# Patient Record
Sex: Male | Born: 1955 | Race: White | Hispanic: No | Marital: Married | Smoking: Former smoker
Health system: Southern US, Community
[De-identification: ages and names within clinical notes are randomized; demographics above are authoritative.]

## PROBLEM LIST (undated history)

## (undated) DIAGNOSIS — R0600 Dyspnea, unspecified: Secondary | ICD-10-CM

## (undated) DIAGNOSIS — E7800 Pure hypercholesterolemia, unspecified: Secondary | ICD-10-CM

## (undated) DIAGNOSIS — I700 Atherosclerosis of aorta: Secondary | ICD-10-CM

## (undated) DIAGNOSIS — D751 Secondary polycythemia: Secondary | ICD-10-CM

## (undated) DIAGNOSIS — E119 Type 2 diabetes mellitus without complications: Secondary | ICD-10-CM

## (undated) DIAGNOSIS — H25019 Cortical age-related cataract, unspecified eye: Secondary | ICD-10-CM

## (undated) DIAGNOSIS — J189 Pneumonia, unspecified organism: Secondary | ICD-10-CM

## (undated) DIAGNOSIS — C210 Malignant neoplasm of anus, unspecified: Secondary | ICD-10-CM

## (undated) DIAGNOSIS — K219 Gastro-esophageal reflux disease without esophagitis: Secondary | ICD-10-CM

## (undated) DIAGNOSIS — Z87442 Personal history of urinary calculi: Secondary | ICD-10-CM

## (undated) DIAGNOSIS — J449 Chronic obstructive pulmonary disease, unspecified: Secondary | ICD-10-CM

## (undated) DIAGNOSIS — M1990 Unspecified osteoarthritis, unspecified site: Secondary | ICD-10-CM

## (undated) DIAGNOSIS — I10 Essential (primary) hypertension: Secondary | ICD-10-CM

## (undated) HISTORY — DX: Secondary polycythemia: D75.1

## (undated) HISTORY — PX: TONSILLECTOMY: SUR1361

## (undated) HISTORY — PX: HERNIA REPAIR: SHX51

## (undated) HISTORY — PX: BLADDER SURGERY: SHX569

---
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
## 2010-02-20 HISTORY — PX: HERNIA REPAIR: SHX51

## 2010-05-11 ENCOUNTER — Ambulatory Visit: Payer: Self-pay | Admitting: Surgery

## 2010-05-18 ENCOUNTER — Ambulatory Visit: Payer: Self-pay | Admitting: Surgery

## 2018-01-09 ENCOUNTER — Other Ambulatory Visit: Payer: Self-pay | Admitting: Family Medicine

## 2018-01-09 DIAGNOSIS — M5412 Radiculopathy, cervical region: Secondary | ICD-10-CM

## 2018-01-19 ENCOUNTER — Ambulatory Visit: Payer: Self-pay

## 2019-09-11 ENCOUNTER — Telehealth: Payer: Self-pay | Admitting: *Deleted

## 2019-09-11 ENCOUNTER — Encounter: Payer: Self-pay | Admitting: *Deleted

## 2019-09-11 DIAGNOSIS — Z122 Encounter for screening for malignant neoplasm of respiratory organs: Secondary | ICD-10-CM

## 2019-09-11 DIAGNOSIS — Z87891 Personal history of nicotine dependence: Secondary | ICD-10-CM

## 2019-09-11 NOTE — Telephone Encounter (Signed)
Received referral for initial lung cancer screening scan. Contacted patient and obtained smoking history,(former, quit 11/2012, 63 pack year) as well as answering questions related to screening process. Patient denies signs of lung cancer such as weight loss or hemoptysis. Patient denies comorbidity that would prevent curative treatment if lung cancer were found. Patient is scheduled for shared decision making visit and CT scan on 10/01/19 at 10am.

## 2019-10-01 ENCOUNTER — Other Ambulatory Visit: Payer: Self-pay

## 2019-10-01 ENCOUNTER — Inpatient Hospital Stay: Payer: BC Managed Care – PPO | Attending: Oncology | Admitting: Oncology

## 2019-10-01 ENCOUNTER — Ambulatory Visit
Admission: RE | Admit: 2019-10-01 | Discharge: 2019-10-01 | Disposition: A | Discharge status: Home / Self Care | Payer: BC Managed Care – PPO | Source: Ambulatory Visit | Attending: Oncology | Admitting: Oncology

## 2019-10-01 DIAGNOSIS — Z87891 Personal history of nicotine dependence: Secondary | ICD-10-CM

## 2019-10-01 DIAGNOSIS — Z122 Encounter for screening for malignant neoplasm of respiratory organs: Secondary | ICD-10-CM

## 2019-10-01 NOTE — Progress Notes (Signed)
Virtual Visit via Video Note  I connected with Mr. Heckmann on 10/01/19 at 10:15 AM EDT by a video enabled telemedicine application and verified that I am speaking with the correct person using two identifiers.  Location: Patient: [facility] Provider: Clinic    I discussed the limitations of evaluation and management by telemedicine and the availability of in person appointments. The patient expressed understanding and agreed to proceed.  I discussed the assessment and treatment plan with the patient. The patient was provided an opportunity to ask questions and all were answered. The patient agreed with the plan and demonstrated an understanding of the instructions.   The patient was advised to call back or seek an in-person evaluation if the symptoms worsen or if the condition fails to improve as anticipated.   In accordance with CMS guidelines, patient has met eligibility criteria including age, absence of signs or symptoms of lung cancer.  Social History   Tobacco Use  . Smoking status: Former Smoker    Packs/day: 1.50    Years: 42.00    Pack years: 63.00    Types: Cigarettes    Quit date: 11/2012    Years since quitting: 6.8  Substance Use Topics  . Alcohol use: Not on file  . Drug use: Not on file      A shared decision-making session was conducted prior to the performance of CT scan. This includes one or more decision aids, includes benefits and harms of screening, follow-up diagnostic testing, over-diagnosis, false positive rate, and total radiation exposure.   Counseling on the importance of adherence to annual lung cancer LDCT screening, impact of co-morbidities, and ability or willingness to undergo diagnosis and treatment is imperative for compliance of the program.   Counseling on the importance of continued smoking cessation for former smokers; the importance of smoking cessation for current smokers, and information about tobacco cessation interventions have been given to  patient including [place] and 1800 quit [place] programs.   Written order for lung cancer screening with LDCT has been given to the patient and any and all questions have been answered to the best of my abilities.    Yearly follow up will be coordinated by Burgess Estelle, Thoracic Navigator.  I provided 15 minutes of face-to-face video visit time during this encounter, and > 50% was spent counseling as documented under my assessment & plan.   Jacquelin Hawking, NP

## 2019-10-06 ENCOUNTER — Telehealth: Payer: Self-pay | Admitting: *Deleted

## 2019-10-06 NOTE — Telephone Encounter (Signed)
Notified patient of LDCT lung cancer screening program results with recommendation for 6 month follow up imaging. Also notified of incidental findings noted below and is encouraged to discuss further with PCP who will receive a copy of this note and/or the CT report. Patient verbalizes understanding.   IMPRESSION: Lung-RADS 3, probably benign findings. Short-term follow-up in 6 months is recommended with repeat low-dose chest CT without contrast (please use the following order, "CT CHEST LCS NODULE FOLLOW-UP W/O CM").  7.3 mm nodular opacity in the posterior right lung base.  Aortic Atherosclerosis (ICD10-I70.0) and Emphysema (ICD10-J43.9).

## 2020-02-21 DIAGNOSIS — J189 Pneumonia, unspecified organism: Secondary | ICD-10-CM

## 2020-02-21 HISTORY — DX: Pneumonia, unspecified organism: J18.9

## 2020-04-05 ENCOUNTER — Other Ambulatory Visit: Payer: Self-pay | Admitting: *Deleted

## 2020-04-05 DIAGNOSIS — R918 Other nonspecific abnormal finding of lung field: Secondary | ICD-10-CM

## 2020-04-05 DIAGNOSIS — Z87891 Personal history of nicotine dependence: Secondary | ICD-10-CM

## 2020-04-05 NOTE — Progress Notes (Addendum)
Contacted and scheduled for LCS Nodule follow up scan. Patient is a former smoker, quit 11/2012, 63 pack year history.  Patient cancelled due to potential expense of CT scan. Offered assistance to patient but he prefers to not have his 6 month follow up scan at this time.

## 2020-04-14 ENCOUNTER — Ambulatory Visit: Admission: RE | Admit: 2020-04-14 | Payer: BC Managed Care – PPO | Source: Ambulatory Visit

## 2020-05-05 ENCOUNTER — Ambulatory Visit: Payer: BC Managed Care – PPO

## 2020-07-30 ENCOUNTER — Other Ambulatory Visit: Payer: Self-pay | Admitting: *Deleted

## 2020-07-30 DIAGNOSIS — Z87891 Personal history of nicotine dependence: Secondary | ICD-10-CM

## 2020-07-30 DIAGNOSIS — Z122 Encounter for screening for malignant neoplasm of respiratory organs: Secondary | ICD-10-CM

## 2020-07-30 NOTE — Progress Notes (Signed)
Contacted and scheduled for annual lung screening scan. Patient is a former smoker, quit 11/2012, 63 pack year history.

## 2020-10-01 ENCOUNTER — Other Ambulatory Visit: Payer: Self-pay | Admitting: Acute Care

## 2020-10-01 DIAGNOSIS — Z87891 Personal history of nicotine dependence: Secondary | ICD-10-CM

## 2020-10-01 DIAGNOSIS — Z122 Encounter for screening for malignant neoplasm of respiratory organs: Secondary | ICD-10-CM

## 2020-10-05 ENCOUNTER — Other Ambulatory Visit: Payer: Self-pay | Admitting: *Deleted

## 2020-10-05 DIAGNOSIS — Z87891 Personal history of nicotine dependence: Secondary | ICD-10-CM

## 2020-10-06 ENCOUNTER — Ambulatory Visit: Payer: BC Managed Care – PPO

## 2020-10-13 ENCOUNTER — Ambulatory Visit
Admission: RE | Admit: 2020-10-13 | Discharge: 2020-10-13 | Disposition: A | Discharge status: Home / Self Care | Payer: BC Managed Care – PPO | Source: Ambulatory Visit | Attending: Acute Care | Admitting: Acute Care

## 2020-10-13 ENCOUNTER — Other Ambulatory Visit: Payer: Self-pay

## 2020-10-13 DIAGNOSIS — Z87891 Personal history of nicotine dependence: Secondary | ICD-10-CM | POA: Insufficient documentation

## 2020-10-25 NOTE — Progress Notes (Signed)
Please call patient and let them  know their  low dose Ct was read as a Lung RADS 2: nodules that are benign in appearance and behavior with a very low likelihood of becoming a clinically active cancer due to size or lack of growth. Recommendation per radiology is for a repeat LDCT in 12 months. .Please let them  know we will order and schedule their  annual screening scan for 09/2021. Please let them  know there was notation of CAD on their  scan.  Please remind the patient  that this is a non-gated exam therefore degree or severity of disease  cannot be determined. Please have them  follow up with their PCP regarding potential risk factor modification, dietary therapy or pharmacologic therapy if clinically indicated. Pt.  is not currently on statin therapy. Please place order for annual  screening scan for  09/2021 and fax results to PCP. Thanks so much.  There was notation of Atherosclerotic calcification . No Cardiology notes in [place]. No statin, please have patient follow up with PCP. Thanks so much

## 2020-10-26 ENCOUNTER — Other Ambulatory Visit: Payer: Self-pay | Admitting: *Deleted

## 2020-10-26 DIAGNOSIS — Z87891 Personal history of nicotine dependence: Secondary | ICD-10-CM

## 2020-12-23 ENCOUNTER — Ambulatory Visit: Payer: Self-pay | Admitting: Surgery

## 2020-12-23 NOTE — H&P (Signed)
Subjective: CC: Anal discharge [R19.8]   HPI:  Joseph Mcgee is a 65 y.o. male who was referrred by Dion Body, MD for above. Symptoms were first noted several months ago. he has some rectal bleeding occurring a few times per week. Bleeding is described as scant, mixed with yellow discharge. Pain is dull and intermittent, confined to the perianal area, without radiation.  Associated with nothing specific, exacerbated by nothing specific.  Feels noticeable lump in the area with drainage, then decreases.  Never had colonoscopy.  Does not report change in BM   Past Medical History:  has a past medical history of Arthritis, Cataract cortical, senile, Erectile dysfunction, Essential hypertension, GERD (gastroesophageal reflux disease), Pneumonia due to COVID-19 virus (02/2020), Pure hypercholesterolemia, and Type 2 diabetes mellitus (CMS-HCC).  Past Surgical History:  has a past surgical history that includes  Bladder surgery, 5732; Right umbilical hernia repair and inguinal hernia repair, 05/18/10; Tonsillectomy (10/29/1957); and Hernia repair.  Family History: family history includes Alzheimer's disease in his maternal aunt and mother; Coronary Artery Disease (Blocked arteries around heart) in his father; Dementia in his mother; Diabetes type II in his mother; High blood pressure (Hypertension) in his father, mother, and sister; No Known Problems in his sister; Obesity in his mother and sister; Stroke in his father and paternal grandmother.  Social History:  reports that he quit smoking about 8 years ago. His smoking use included cigarettes. He started smoking about 49 years ago. He has a 41.00 pack-year smoking history. He has never used smokeless tobacco. He reports that he does not drink alcohol and does not use drugs.  Current Medications: has a current medication list which includes the following prescription(s): ascorbic acid (vitamin c), aspirin, contour next test strips, toujeo solostar  u-300 insulin, cyanocobalamin, dapagliflozin, ezetimibe, fluticasone propionate, hydrochlorothiazide, insulin lispro, lancets, losartan, magnesium, metformin, omega-3 fatty acids-fish oil, semaglutide, unifine pentips, and zinc.  Allergies:  Allergies as of 12/22/2020 - Reviewed 11/10/2020 Allergen Reaction Noted  Lisinopril Cough 04/25/2017  Lovastatin Muscle Pain 09/29/2013  Vasotec [enalapril maleate] Cough 09/29/2013   ROS:  A 15 point review of systems was performed and pertinent positives and negatives noted in HPI  Objective:    There were no vitals taken for this visit.  Constitutional :  No distress, cooperative, alert Lymphatics/Throat:  Supple with no lymphadenopathy Respiratory:  Clear to auscultation bilaterally Cardiovascular:  Regular rate and rhythm Gastrointestinal: Soft, non-tender, non-distended, no organomegaly. Musculoskeletal: Steady gait and movement Skin: Cool and moist, no surgical scars Psychiatric: Normal affect, non-agitated, not confused Rectal: External exam normal.  DRE noted some fullness with slightly tighter sphincter tone, further visualization of the area noted a possible ulceration at the right lateral aspect with no active bleeding or discharge. right at anal verge with more TTP compared to other areas. Anoscopy aborted due to discomfort     LABS:  n/a   RADS:  Assessment:    Anal discharge [R19.8] source unclear but possible ulcerated lesion noted slightly proximal to anal verge on limited bedside exam.  Pt also overdue for screening colonoscopy, so recommended EUA and colonoscopy at same time.  Plan:   1. Anal discharge [R19.8] Discussed risks/benefits/alternatives to surgery.  Alternatives include the options of observation, medical management.  Benefits include symptomatic relief.  I discussed  in detail and the complications related to the operation and the anesthesia, including bleeding, infection, recurrence, remote possibility of  temporary or permanent fecal incontinence, poor/delayed wound healing, chronic pain, and additional procedures to  address said risks. The risks of general anesthetic, if used, includes MI, CVA, sudden death or even reaction to anesthetic medications also discussed.    The patient understands the risks, any and all questions were answered to the patient's satisfaction.  2. Patient has elected to proceed with surgical treatment. Procedure will be scheduled.  Written consent was obtained.

## 2021-02-21 ENCOUNTER — Inpatient Hospital Stay: Admission: RE | Admit: 2021-02-21 | Payer: BC Managed Care – PPO | Source: Ambulatory Visit

## 2021-02-22 ENCOUNTER — Other Ambulatory Visit: Payer: Self-pay

## 2021-02-22 ENCOUNTER — Other Ambulatory Visit
Admission: RE | Admit: 2021-02-22 | Discharge: 2021-02-22 | Disposition: A | Discharge status: Home / Self Care | Payer: Medicare Other | Source: Ambulatory Visit | Attending: Surgery | Admitting: Surgery

## 2021-02-22 DIAGNOSIS — I10 Essential (primary) hypertension: Secondary | ICD-10-CM

## 2021-02-22 HISTORY — DX: Essential (primary) hypertension: I10

## 2021-02-22 HISTORY — DX: Unspecified osteoarthritis, unspecified site: M19.90

## 2021-02-22 HISTORY — DX: Type 2 diabetes mellitus without complications: E11.9

## 2021-02-22 HISTORY — DX: Pneumonia, unspecified organism: J18.9

## 2021-02-22 HISTORY — DX: Chronic obstructive pulmonary disease, unspecified: J44.9

## 2021-02-22 HISTORY — DX: Dyspnea, unspecified: R06.00

## 2021-02-22 NOTE — Pre-Procedure Instructions (Signed)
Wife, Joseph Mcgee assisted in call as [name] is at work and unavailable.

## 2021-02-22 NOTE — Patient Instructions (Addendum)
Your procedure is scheduled on: 03/03/21 - Thursday Report to the Registration Desk on the 1st floor of the [facility]. To find out your arrival time, please call [phone] between 1PM - 3PM on: 03/02/21 - Wednesday Report to [facility] for Ekg/Labs on 02/23/21 at 9:30 am.  REMEMBER: Instructions that are not followed completely may result in serious medical risk, up to and including death; or upon the discretion of your surgeon and anesthesiologist your surgery may need to be rescheduled.  Do not eat food after midnight the night before surgery.  No gum chewing, lozengers or hard candies.  You may however, drink CLEAR liquids up to 2 hours before you are scheduled to arrive for your surgery. Do not drink anything within 2 hours of your scheduled arrival time.  Clear liquids include: - water  Type 1 and Type 2 diabetics should only drink water.  TAKE THESE MEDICATIONS THE MORNING OF SURGERY WITH A SIP OF WATER:  - ezetimibe (ZETIA) 10 MG tablet  Diabetes medications :   - Do not take dapagliflozin propanediol (FARXIGA) 10 MG TABS tablet on 01/09, 01/10, 01/11 and do not take the day of surgery.  - Only inject 1/2 of bedtime Insulin , insulin glargine, 1 Unit Dial, (TOUJEO SOLOSTAR) 300 UNIT/ML Solostar Pen the night before procedure.  - Do not take MetFORMIN (GLUCOPHAGE) 1000 MG tablet on 01/10, 01/11 and do not take the day of procedure.   Follow recommendations from Cardiologist, Pulmonologist or PCP regarding stopping Aspirin, Coumadin, Plavix, Eliquis, Pradaxa, or Pletal. Stop taking Aspirin 81 mg beginning 02/25/21.  One week prior to surgery: Stop Anti-inflammatories (NSAIDS) such as Advil, Aleve, Ibuprofen, Motrin, Naproxen, Naprosyn and Aspirin based products such as Excedrin, Goodys Powder, BC Powder.  Stop ANY OVER THE COUNTER supplements until after surgery.  You may however, continue to take Tylenol if needed for pain up until the day of surgery.  No  Alcohol for 24 hours before or after surgery.  No Smoking including e-cigarettes for 24 hours prior to surgery.  No chewable tobacco products for at least 6 hours prior to surgery.  No nicotine patches on the day of surgery.  Do not use any "recreational" drugs for at least a week prior to your surgery.  Please be advised that the combination of cocaine and anesthesia may have negative outcomes, up to and including death. If you test positive for cocaine, your surgery will be cancelled.  On the morning of surgery brush your teeth with toothpaste and water, you may rinse your mouth with mouthwash if you wish. Do not swallow any toothpaste or mouthwash.  Take a fresh shower the day of procedure, you may apply deodorant.  Do not wear jewelry, make-up, hairpins, clips or nail polish.  Do not wear lotions, powders, or perfumes.   Do not shave body from the neck down 48 hours prior to surgery just in case you cut yourself which could leave a site for infection.  Also, freshly shaved skin may become irritated if using the CHG soap.  Contact lenses, hearing aids and dentures may not be worn into surgery.  Do not bring valuables to the hospital. [facility] is not responsible for any missing/lost belongings or valuables.   Notify your doctor if there is any change in your medical condition (cold, fever, infection).  Wear comfortable clothing (specific to your surgery type) to the hospital.  After surgery, you can help prevent lung complications by doing breathing exercises.  Take deep  breaths and cough every 1-2 hours. Your doctor may order a device called an Incentive Spirometer to help you take deep breaths. When coughing or sneezing, hold a pillow firmly against your incision with both hands. This is called splinting. Doing this helps protect your incision. It also decreases belly discomfort.  If you are being admitted to the hospital overnight, leave your suitcase in the car. After  surgery it may be brought to your room.  If you are being discharged the day of surgery, you will not be allowed to drive home. You will need a responsible adult (18 years or older) to drive you home and stay with you that night.   If you are taking public transportation, you will need to have a responsible adult (18 years or older) with you. Please confirm with your physician that it is acceptable to use public transportation.   Please call the [facility] at [phone] if you have any questions about these instructions.  Surgery Visitation Policy:  Patients undergoing a surgery or procedure may have one family member or support person with them as long as that person is not COVID-19 positive or experiencing its symptoms.  That person may remain in the waiting area during the procedure and may rotate out with other people.  Inpatient Visitation:    Visiting hours are 7 a.m. to 8 p.m. Up to two visitors ages 16+ are allowed at one time in a patient room. The visitors may rotate out with other people during the day. Visitors must check out when they leave, or other visitors will not be allowed. One designated support person may remain overnight. The visitor must pass COVID-19 screenings, use hand sanitizer when entering and exiting the patients room and wear a mask at all times, including in the patients room. Patients must also wear a mask when staff or their visitor are in the room. Masking is required regardless of vaccination status.

## 2021-02-23 ENCOUNTER — Other Ambulatory Visit
Admission: RE | Admit: 2021-02-23 | Discharge: 2021-02-23 | Disposition: A | Discharge status: Home / Self Care | Payer: Medicare Other | Source: Ambulatory Visit | Attending: Surgery | Admitting: Surgery

## 2021-02-23 DIAGNOSIS — Z01818 Encounter for other preprocedural examination: Secondary | ICD-10-CM | POA: Insufficient documentation

## 2021-02-23 DIAGNOSIS — I10 Essential (primary) hypertension: Secondary | ICD-10-CM | POA: Diagnosis not present

## 2021-02-23 LAB — CBC
HCT: 49.7 % (ref 39.0–52.0)
Hemoglobin: 17 g/dL (ref 13.0–17.0)
MCH: 30.2 pg (ref 26.0–34.0)
MCHC: 34.2 g/dL (ref 30.0–36.0)
MCV: 88.3 fL (ref 80.0–100.0)
Platelets: 276 10*3/uL (ref 150–400)
RBC: 5.63 MIL/uL (ref 4.22–5.81)
RDW: 13.2 % (ref 11.5–15.5)
WBC: 8.7 10*3/uL (ref 4.0–10.5)
nRBC: 0 % (ref 0.0–0.2)

## 2021-02-23 LAB — BASIC METABOLIC PANEL
Anion gap: 10 (ref 5–15)
BUN: 25 mg/dL — ABNORMAL HIGH (ref 8–23)
CO2: 23 mmol/L (ref 22–32)
Calcium: 8.8 mg/dL — ABNORMAL LOW (ref 8.9–10.3)
Chloride: 103 mmol/L (ref 98–111)
Creatinine, Ser: 1.09 mg/dL (ref 0.61–1.24)
GFR, Estimated: >60 mL/min (ref >60)
Glucose, Bld: 290 mg/dL — ABNORMAL HIGH (ref 70–99)
Potassium: 4 mmol/L (ref 3.5–5.1)
Sodium: 136 mmol/L (ref 135–145)

## 2021-03-03 ENCOUNTER — Encounter
Admission: RE | Disposition: A | Discharge status: Home / Self Care | Payer: Self-pay | Source: Ambulatory Visit | Attending: Surgery

## 2021-03-03 ENCOUNTER — Ambulatory Visit: Payer: Medicare Other | Admitting: Anesthesiology

## 2021-03-03 ENCOUNTER — Ambulatory Visit
Admission: RE | Admit: 2021-03-03 | Discharge: 2021-03-03 | Disposition: A | Discharge status: Home / Self Care | Payer: Medicare Other | Source: Ambulatory Visit | Attending: Surgery | Admitting: Surgery

## 2021-03-03 ENCOUNTER — Encounter: Payer: Self-pay | Admitting: Surgery

## 2021-03-03 ENCOUNTER — Other Ambulatory Visit: Payer: Self-pay

## 2021-03-03 DIAGNOSIS — E7800 Pure hypercholesterolemia, unspecified: Secondary | ICD-10-CM | POA: Insufficient documentation

## 2021-03-03 DIAGNOSIS — J449 Chronic obstructive pulmonary disease, unspecified: Secondary | ICD-10-CM | POA: Diagnosis not present

## 2021-03-03 DIAGNOSIS — E119 Type 2 diabetes mellitus without complications: Secondary | ICD-10-CM | POA: Diagnosis not present

## 2021-03-03 DIAGNOSIS — K219 Gastro-esophageal reflux disease without esophagitis: Secondary | ICD-10-CM | POA: Diagnosis not present

## 2021-03-03 DIAGNOSIS — C211 Malignant neoplasm of anal canal: Secondary | ICD-10-CM | POA: Diagnosis not present

## 2021-03-03 DIAGNOSIS — I10 Essential (primary) hypertension: Secondary | ICD-10-CM | POA: Insufficient documentation

## 2021-03-03 DIAGNOSIS — R198 Other specified symptoms and signs involving the digestive system and abdomen: Secondary | ICD-10-CM | POA: Diagnosis present

## 2021-03-03 DIAGNOSIS — Z8616 Personal history of COVID-19: Secondary | ICD-10-CM | POA: Diagnosis not present

## 2021-03-03 DIAGNOSIS — K641 Second degree hemorrhoids: Secondary | ICD-10-CM | POA: Diagnosis not present

## 2021-03-03 DIAGNOSIS — Z87891 Personal history of nicotine dependence: Secondary | ICD-10-CM | POA: Insufficient documentation

## 2021-03-03 DIAGNOSIS — Z1211 Encounter for screening for malignant neoplasm of colon: Secondary | ICD-10-CM | POA: Diagnosis not present

## 2021-03-03 DIAGNOSIS — K625 Hemorrhage of anus and rectum: Secondary | ICD-10-CM | POA: Insufficient documentation

## 2021-03-03 DIAGNOSIS — K629 Disease of anus and rectum, unspecified: Secondary | ICD-10-CM

## 2021-03-03 HISTORY — PX: COLONOSCOPY WITH PROPOFOL: SHX5780

## 2021-03-03 HISTORY — PX: EVALUATION UNDER ANESTHESIA WITH HEMORRHOIDECTOMY: SHX5624

## 2021-03-03 LAB — GLUCOSE, CAPILLARY
Glucose-Capillary: 126 mg/dL — ABNORMAL HIGH (ref 70–99)
Glucose-Capillary: 167 mg/dL — ABNORMAL HIGH (ref 70–99)

## 2021-03-03 SURGERY — COLONOSCOPY WITH PROPOFOL
Anesthesia: General

## 2021-03-03 SURGERY — EXAM UNDER ANESTHESIA WITH HEMORRHOIDECTOMY
Anesthesia: General | Site: Anus

## 2021-03-03 MED ORDER — GELATIN ABSORBABLE 12-7 MM EX MISC
CUTANEOUS | Status: AC
  Filled 2021-03-03: qty 1

## 2021-03-03 MED ORDER — PROPOFOL 500 MG/50ML IV EMUL
INTRAVENOUS | Status: AC
  Filled 2021-03-03: qty 50

## 2021-03-03 MED ORDER — CEFAZOLIN SODIUM-DEXTROSE 2-3 GM-%(50ML) IV SOLR
INTRAVENOUS | Status: DC | PRN
  Administered 2021-03-03: 2 g via INTRAVENOUS

## 2021-03-03 MED ORDER — FENTANYL CITRATE (PF) 100 MCG/2ML IJ SOLN: 25.0000 ug | INTRAMUSCULAR | Status: DC | PRN

## 2021-03-03 MED ORDER — 0.9 % SODIUM CHLORIDE (POUR BTL) OPTIME
TOPICAL | Status: DC | PRN
  Administered 2021-03-03: 20 mL

## 2021-03-03 MED ORDER — SODIUM CHLORIDE 0.9 % IV SOLN: INTRAVENOUS | Status: DC

## 2021-03-03 MED ORDER — BUPIVACAINE-EPINEPHRINE (PF) 0.5% -1:200000 IJ SOLN
INTRAMUSCULAR | Status: DC | PRN
  Administered 2021-03-03: 15 mL

## 2021-03-03 MED ORDER — HYDROCODONE-ACETAMINOPHEN 5-325 MG PO TABS: 1.0000 | ORAL_TABLET | Freq: Four times a day (QID) | ORAL | 0 refills | Status: DC | PRN

## 2021-03-03 MED ORDER — PHENYLEPHRINE HCL (PRESSORS) 10 MG/ML IV SOLN
INTRAVENOUS | Status: DC | PRN
  Administered 2021-03-03 (×2): 160 ug via INTRAVENOUS

## 2021-03-03 MED ORDER — LIDOCAINE HCL URETHRAL/MUCOSAL 2 % EX GEL
CUTANEOUS | Status: AC
  Filled 2021-03-03: qty 5

## 2021-03-03 MED ORDER — FENTANYL CITRATE (PF) 100 MCG/2ML IJ SOLN
INTRAMUSCULAR | Status: DC | PRN
  Administered 2021-03-03 (×2): 25 ug via INTRAVENOUS

## 2021-03-03 MED ORDER — PROPOFOL 10 MG/ML IV BOLUS
INTRAVENOUS | Status: DC | PRN
  Administered 2021-03-03: 60 mg via INTRAVENOUS

## 2021-03-03 MED ORDER — CEFAZOLIN SODIUM-DEXTROSE 2-4 GM/100ML-% IV SOLN
INTRAVENOUS | Status: AC
  Filled 2021-03-03: qty 100

## 2021-03-03 MED ORDER — DOCUSATE SODIUM 100 MG PO CAPS: 100.0000 mg | ORAL_CAPSULE | Freq: Two times a day (BID) | ORAL | 0 refills | Status: AC | PRN

## 2021-03-03 MED ORDER — CHLORHEXIDINE GLUCONATE 0.12 % MT SOLN
OROMUCOSAL | Status: AC
  Administered 2021-03-03: 15 mL via OROMUCOSAL
  Filled 2021-03-03: qty 15

## 2021-03-03 MED ORDER — IBUPROFEN 800 MG PO TABS: 800.0000 mg | ORAL_TABLET | Freq: Three times a day (TID) | ORAL | 0 refills | Status: DC | PRN

## 2021-03-03 MED ORDER — CHLORHEXIDINE GLUCONATE CLOTH 2 % EX PADS: 6.0000 | MEDICATED_PAD | Freq: Once | CUTANEOUS | Status: DC

## 2021-03-03 MED ORDER — BUPIVACAINE LIPOSOME 1.3 % IJ SUSP
INTRAMUSCULAR | Status: DC | PRN
  Administered 2021-03-03: 20 mL

## 2021-03-03 MED ORDER — PROPOFOL 500 MG/50ML IV EMUL
INTRAVENOUS | Status: DC | PRN
  Administered 2021-03-03: 200 ug/kg/min via INTRAVENOUS

## 2021-03-03 MED ORDER — CHLORHEXIDINE GLUCONATE 0.12 % MT SOLN: 15.0000 mL | Freq: Once | OROMUCOSAL | Status: AC

## 2021-03-03 MED ORDER — BUPIVACAINE LIPOSOME 1.3 % IJ SUSP
INTRAMUSCULAR | Status: AC
  Filled 2021-03-03: qty 20

## 2021-03-03 MED ORDER — ACETAMINOPHEN 325 MG PO TABS: 650.0000 mg | ORAL_TABLET | Freq: Three times a day (TID) | ORAL | 0 refills | Status: DC | PRN

## 2021-03-03 MED ORDER — BUPIVACAINE-EPINEPHRINE (PF) 0.5% -1:200000 IJ SOLN
INTRAMUSCULAR | Status: AC
  Filled 2021-03-03: qty 30

## 2021-03-03 MED ORDER — FENTANYL CITRATE (PF) 100 MCG/2ML IJ SOLN
INTRAMUSCULAR | Status: AC
  Filled 2021-03-03: qty 2

## 2021-03-03 MED ORDER — LIDOCAINE HCL (PF) 2 % IJ SOLN
INTRAMUSCULAR | Status: AC
  Filled 2021-03-03: qty 5

## 2021-03-03 MED ORDER — LIDOCAINE 5 % EX OINT: 1.0000 "application " | TOPICAL_OINTMENT | CUTANEOUS | 0 refills | Status: DC | PRN

## 2021-03-03 MED ORDER — PHENYLEPHRINE HCL (PRESSORS) 10 MG/ML IV SOLN
INTRAVENOUS | Status: AC
  Filled 2021-03-03: qty 1

## 2021-03-03 MED ORDER — ORAL CARE MOUTH RINSE: 15.0000 mL | Freq: Once | OROMUCOSAL | Status: AC

## 2021-03-03 SURGICAL SUPPLY — 33 items
BLADE SURG 15 STRL LF DISP TIS (BLADE) ×1 IMPLANT
BLADE SURG 15 STRL SS (BLADE) ×2
BRIEF STRETCH FOR OB PAD XXL (UNDERPADS AND DIAPERS) ×2 IMPLANT
DRAPE PERI LITHO V/GYN (MISCELLANEOUS) ×2 IMPLANT
DRAPE UNDER BUTTOCK W/FLU (DRAPES) ×2 IMPLANT
DRSG GAUZE FLUFF 36X18 (GAUZE/BANDAGES/DRESSINGS) ×2 IMPLANT
ELECT REM PT RETURN 9FT ADLT (ELECTROSURGICAL) ×2
ELECTRODE REM PT RTRN 9FT ADLT (ELECTROSURGICAL) ×1 IMPLANT
GAUZE 4X4 16PLY ~~LOC~~+RFID DBL (SPONGE) ×2 IMPLANT
GLOVE SURG SYN 6.5 ES PF (GLOVE) ×2 IMPLANT
GLOVE SURG SYN 6.5 PF PI (GLOVE) ×1 IMPLANT
GLOVE SURG UNDER POLY LF SZ7 (GLOVE) ×2 IMPLANT
GOWN STRL REUS W/ TWL LRG LVL3 (GOWN DISPOSABLE) ×2 IMPLANT
GOWN STRL REUS W/TWL LRG LVL3 (GOWN DISPOSABLE) ×4
KIT TURNOVER CYSTO (KITS) ×2 IMPLANT
LABEL OR SOLS (LABEL) ×2 IMPLANT
MANIFOLD NEPTUNE II (INSTRUMENTS) ×2 IMPLANT
NDL HYPO 25X1 1.5 SAFETY (NEEDLE) ×1 IMPLANT
NEEDLE HYPO 22GX1.5 SAFETY (NEEDLE) ×2 IMPLANT
NEEDLE HYPO 25X1 1.5 SAFETY (NEEDLE) ×2 IMPLANT
NS IRRIG 500ML POUR BTL (IV SOLUTION) ×2 IMPLANT
PACK BASIN MINOR ARMC (MISCELLANEOUS) ×2 IMPLANT
PAD PREP 24X41 OB/GYN DISP (PERSONAL CARE ITEMS) ×2 IMPLANT
SHEARS HARMONIC 9CM CVD (BLADE) ×1 IMPLANT
SOL PREP PVP 2OZ (MISCELLANEOUS) ×2
SOLUTION PREP PVP 2OZ (MISCELLANEOUS) ×1 IMPLANT
SURGILUBE 2OZ TUBE FLIPTOP (MISCELLANEOUS) ×2 IMPLANT
SUT SILK 3-0 (SUTURE) ×1 IMPLANT
SUT VIC AB 3-0 SH 27 (SUTURE) ×2
SUT VIC AB 3-0 SH 27X BRD (SUTURE) ×1 IMPLANT
SYR 10ML LL (SYRINGE) ×2 IMPLANT
SYR 20ML LL LF (SYRINGE) ×2 IMPLANT
WATER STERILE IRR 500ML POUR (IV SOLUTION) ×2 IMPLANT

## 2021-03-03 NOTE — Anesthesia Procedure Notes (Signed)
Date/Time: 03/03/2021 3:36 PM Performed by: Nelda Marseille, CRNA Oxygen Delivery Method: Simple face mask

## 2021-03-03 NOTE — Op Note (Signed)
Preoperative diagnosis: anal lesion.   Postoperative diagnosis: same plus second degree hemorrhoids  Procedure: exam under anesthesia, excision of anal lesion  Surgeon: Lysle Pearl  Anesthesia: general  Specimen: anal lesion x1  Complications: none  EBL: 74mL  Wound classification: Clean Contaminated  Indications: Patient is a 66 y.o. male was found to have symptomatic anal lesion, see H&P  Findings: Right posterior anal lesion 2. Internal and external anal sphincter palpated and preserved 3. Adequate hemostasis  Description of procedure: The patient was brought to the operating room and general anesthesia was induced. Patient was placed high lithotomy position. A time-out was completed verifying correct patient, procedure, site, positioning, and implant(s) and/or special equipment prior to beginning this procedure. The perineum was prepped and draped in standard sterile fashion. Local anesthetic was injected as a perianal block. An anoscope was introduced and flat anal lesion measuring approximately 5cm x 2.5cm wide noted on right posterior area at anal verge extending proximal.  The lesion was able to be lifted off the palpable anal sphincter, so decision made to proceed with full excision.   A harmonic was placed across under the lesoin and removed completely  Specimen was passed off operative field pending pathology.  3-0 Vicryl then used to close the open wound.  No sphincter involvement was noted and grossly visible lesion area completely excised.  Hemorrhoids noted on left side but due to the exact pathology of lesion removed pending, decision made to forego proceeding with additional hemorrhoidecotmy.      Last inspection of the anal canal did not note any additional pathology. Exparel injected as a perianal block. A gauze pad was tucked between the gluteal folds, and secured in place with mesh underwear.  The patient tolerated the procedure well and was taken to the postanesthesia  care unit in stable condition.  Sponge and instrument count correct at end of procedure.

## 2021-03-03 NOTE — Interval H&P Note (Signed)
History and Physical Interval Note:  03/03/2021 2:39 PM  Joseph Mcgee  has presented today for surgery, with the diagnosis of Anal drainage.  The various methods of treatment have been discussed with the patient and family. After consideration of risks, benefits and other options for treatment, the patient has consented to  Procedure(s): EXAM UNDER ANESTHESIA WITH HEMORRHOIDECTOMY (N/A) COLONOSCOPY WITH PROPOFOL (N/A) as a surgical intervention.  The patient's history has been reviewed, patient examined, no change in status, stable for surgery.  I have reviewed the patient's chart and labs.  Questions were answered to the patient's satisfaction.     Cari Burgo Lysle Pearl

## 2021-03-03 NOTE — Discharge Instructions (Addendum)
Hemorrhoids, Care After This sheet gives you information about how to care for yourself after your procedure. Your health care provider may also give you more specific instructions. If you have problems or questions, contact your health care provider. What can I expect after the procedure? After the procedure, it is common to have: Soreness. Bruising. Itching.  Follow these instructions at home: site care Follow instructions from your health care provider about how to take care of your site. Make sure you: LEAVE packing in place until it falls out on its own.  No need to replace afterwards Leave stitches (sutures), skin glue, or adhesive strips in place.  If the area bleeds or bruises, apply gentle pressure for 10 minutes. OK TO SHOWER IN 24HRS  General instructions RESUME ASPIRIN IN 48HRS Rest and then return to your normal activities as told by your health care provider. tylenol and advil as needed for discomfort.  Please alternate between the two every four hours as needed for pain.    Use narcotics, if prescribed, only when tylenol and motrin is not enough to control pain.  325-650mg  every 8hrs to max of 3000mg /24hrs (including the 325mg  in every norco dose) for the tylenol.    Advil up to 800mg  per dose every 8hrs as needed for pain.   Keep all follow-up visits as told by your health care provider. This is important. Contact a health care provider if you have excessive: redness, swelling, or pain around your site. blood coming from your site. pus or a bad smell coming from your site. You have a fever.  Get help right away if: You have bleeding that does not stop with pressure or a dressing. Summary After the procedure, it is common to have some soreness, bruising, and itching at the site. Follow instructions from your health care provider about how to take care of your site. Keep all follow-up visits as told by your health care provider. This is important. This information is  not intended to replace advice given to you by your health care provider. Make sure you discuss any questions you have with your health care provider. Document Released: 03/05/2015 Document Revised: 08/06/2017 Document Reviewed: 08/06/2017 Elsevier Interactive Patient Education  2019 [place]   The drugs that you were given will stay in your system until tomorrow so for the next 24 hours you should not:  Drive an automobile Make any legal decisions Drink any alcoholic beverage   You may resume regular meals tomorrow.  Today it is better to start with liquids and gradually work up to solid foods.  You may eat anything you prefer, but it is better to start with liquids, then soup and crackers, and gradually work up to solid foods.   Please notify your doctor immediately if you have any unusual bleeding, trouble breathing, redness and pain at the surgery site, drainage, fever, or pain not relieved by medication.    Additional Instructions:     Please contact your physician with any problems or Same Day Surgery at [phone], Monday through Friday 6 am to 4 pm, or [place] at [facility] number at [phone].

## 2021-03-03 NOTE — H&P (Signed)
Subjective:  CC: Anal discharge [R19.8]   HPI: Joseph Mcgee is a 66 y.o. male who was referrred by Dion Body, MD for above. Symptoms were first noted several months ago. he has some rectal bleeding occurring a few times per week. Bleeding is described as scant, mixed with yellow discharge. Pain is dull and intermittent, confined to the perianal area, without radiation. Associated with nothing specific, exacerbated by nothing specific.  Feels noticeable lump in the area with drainage, then decreases.  Never had colonoscopy. Does not report change in BM  Past Medical History: has a past medical history of Arthritis, Cataract cortical, senile, Erectile dysfunction, Essential hypertension, GERD (gastroesophageal reflux disease), Pneumonia due to COVID-19 virus (02/2020), Pure hypercholesterolemia, and Type 2 diabetes mellitus (CMS-HCC).  Past Surgical History: has a past surgical history that includes Bladder surgery, 8280; Right umbilical hernia repair and inguinal hernia repair, 05/18/10; Tonsillectomy (10/29/1957); and Hernia repair.  Family History: family history includes Alzheimer's disease in his maternal aunt and mother; Coronary Artery Disease (Blocked arteries around heart) in his father; Dementia in his mother; Diabetes type II in his mother; High blood pressure (Hypertension) in his father, mother, and sister; No Known Problems in his sister; Obesity in his mother and sister; Stroke in his father and paternal grandmother.  Social History: reports that he quit smoking about 8 years ago. His smoking use included cigarettes. He started smoking about 49 years ago. He has a 41.00 pack-year smoking history. He has never used smokeless tobacco. He reports that he does not drink alcohol and does not use drugs.  Current Medications: has a current medication list which includes the following prescription(s): ascorbic acid (vitamin c), aspirin, contour next test strips, toujeo solostar u-300  insulin, cyanocobalamin, dapagliflozin, ezetimibe, fluticasone propionate, hydrochlorothiazide, insulin lispro, lancets, losartan, magnesium, metformin, omega-3 fatty acids-fish oil, semaglutide, unifine pentips, and zinc.  Allergies:  Allergies as of 12/22/2020 - Reviewed 11/10/2020  Allergen Reaction Noted   Lisinopril Cough 04/25/2017   Lovastatin Muscle Pain 09/29/2013   Vasotec [enalapril maleate] Cough 09/29/2013   ROS:  A 15 point review of systems was performed and pertinent positives and negatives noted in HPI  Objective:    There were no vitals taken for this visit.  Constitutional : No distress, cooperative, alert  Lymphatics/Throat: Supple with no lymphadenopathy  Respiratory: Clear to auscultation bilaterally  Cardiovascular: Regular rate and rhythm  Gastrointestinal: Soft, non-tender, non-distended, no organomegaly.  Musculoskeletal: Steady gait and movement  Skin: Cool and moist, no surgical scars  Psychiatric: Normal affect, non-agitated, not confused  Rectal: External exam normal. DRE noted some fullness with slightly tighter sphincter tone, further visualization of the area noted a possible ulceration at the right lateral aspect with no active bleeding or discharge. right at anal verge with more TTP compared to other areas. Anoscopy aborted due to discomfort    LABS:  n/a   RADS:  Assessment:    Anal discharge [R19.8] source unclear but possible ulcerated lesion noted slightly proximal to anal verge on limited bedside exam. Pt also overdue for screening colonoscopy, so recommended EUA and colonoscopy at same time.  Plan:   1. Anal discharge [R19.8] Discussed risks/benefits/alternatives to surgery. Alternatives include the options of observation, medical management. Benefits include symptomatic relief. I discussed in detail and the complications related to the operation and the anesthesia, including bleeding, infection, recurrence, remote possibility of  temporary or permanent fecal incontinence, poor/delayed wound healing, chronic pain, and additional procedures to address said risks. The risks of  general anesthetic, if used, includes MI, CVA, sudden death or even reaction to anesthetic medications also discussed.   The patient understands the risks, any and all questions were answered to the patient's satisfaction.  2. Patient has elected to proceed with surgical treatment. Procedure will be scheduled. Written consent was obtained.

## 2021-03-03 NOTE — Op Note (Signed)
[facility]astroenter[facility] Patient Name: Joseph Mcgee Procedure Date: 03/03/2021 2:57 PM MRN: [id number] Account #: [id number] Date of Birth: [birth year] Admit Type: Outpatient Age: 66 Room: [facility] ENDO ROOM 3 Gender: Male Note Status: Finalized Instrument Name: Colonoscope [id number],Colonoscope [id number] Procedure:             Colonoscopy Indications:           Screening for colorectal malignant neoplasm Providers:             Eliseo Squires MD, MD Medicines:             Propofol per Anesthesia Complications:         No immediate complications. Procedure:             Pre-Anesthesia Assessment:                        - After reviewing the risks and benefits, the patient                         was deemed in satisfactory condition to undergo the                         procedure in an ambulatory setting.                        After obtaining informed consent, the colonoscope was                         passed under direct vision. Throughout the procedure,                         the patient's blood pressure, pulse, and oxygen                         saturations were monitored continuously. The                         Colonoscope was introduced through the anus and                         advanced to the the cecum, identified by the ileocecal                         valve. The Colonoscope was introduced through the and                         advanced to the. The colonoscopy was performed with                         ease. The patient tolerated the procedure well. The                         quality of the bowel preparation was good. Findings:      The digital rectal exam revealed a 4 cm (diameter) firm anal mass       palpated 0 to 1 cm from the anal verge. The mass was circumferential.      Non-bleeding internal hemorrhoids were found during retroflexion. The       hemorrhoids were Grade II (internal hemorrhoids  that prolapse but reduce       spontaneously). Impression:             - Anal mass 0 to 1 cm from the anal verge.                        - Non-bleeding internal hemorrhoids.                        - No specimens collected. Recommendation:        - Discharge patient to home.                        - Written discharge instructions were provided to the                         patient.                        - Repeat colonoscopy in 10 years for screening                         purposes. Procedure Code(s):     --- Professional ---                        [phone], Colonoscopy, flexible; diagnostic, including                         collection of specimen(s) by brushing or washing, when                         performed (separate procedure) Diagnosis Code(s):     --- Professional ---                        Z12.11, Encounter for screening for malignant neoplasm                         of colon                        K64.1, Second degree hemorrhoids                        K62.89, Other specified diseases of anus and rectum CPT copyright 2019 American Medical Association. All rights reserved. The codes documented in this report are preliminary and upon coder review may  be revised to meet current compliance requirements. Dr. Sheppard Penton, MD Eliseo Squires MD, MD 03/03/2021 3:19:32 PM This report has been signed electronically. Number of Addenda: 0 Note Initiated On: 03/03/2021 2:57 PM Scope Withdrawal Time: 0 hours 6 minutes 29 seconds  Total Procedure Duration: 0 hours 9 minutes 2 seconds  Estimated Blood Loss:  Estimated blood loss: none.      [facility]

## 2021-03-03 NOTE — Anesthesia Preprocedure Evaluation (Signed)
Anesthesia Evaluation  Patient identified by MRN, date of birth, ID band Patient awake    Reviewed: Allergy & Precautions, H&P , NPO status , Patient's Chart, lab work & pertinent test results, reviewed documented beta blocker date and time   History of Anesthesia Complications Negative for: history of anesthetic complications  Airway Mallampati: II  TM Distance: >3 FB Neck ROM: full    Dental  (+) Dental Advidsory Given, Caps, Teeth Intact   Pulmonary shortness of breath and with exertion, neg sleep apnea, COPD, neg recent URI, former smoker,    Pulmonary exam normal breath sounds clear to auscultation       Cardiovascular Exercise Tolerance: Good hypertension, (-) angina(-) Past MI and (-) Cardiac Stents Normal cardiovascular exam(-) dysrhythmias (-) Valvular Problems/Murmurs Rhythm:regular Rate:Normal     Neuro/Psych negative neurological ROS  negative psych ROS   GI/Hepatic Neg liver ROS, GERD  Controlled,  Endo/Other  diabetes  Renal/GU negative Renal ROS  negative genitourinary   Musculoskeletal   Abdominal   Peds  Hematology negative hematology ROS (+)   Anesthesia Other Findings Past Medical History: No date: Arthritis No date: COPD (chronic obstructive pulmonary disease) (HCC) No date: Diabetes mellitus without complication (HCC) No date: Dyspnea No date: Hypertension No date: Pneumonia   Reproductive/Obstetrics negative OB ROS                             Anesthesia Physical Anesthesia Plan  ASA: 2  Anesthesia Plan: General   Post-op Pain Management:    Induction: Intravenous  PONV Risk Score and Plan: 2 and Propofol infusion and TIVA  Airway Management Planned: Natural Airway  Additional Equipment:   Intra-op Plan:   Post-operative Plan:   Informed Consent: I have reviewed the patients History and Physical, chart, labs and discussed the procedure including  the risks, benefits and alternatives for the proposed anesthesia with the patient or authorized representative who has indicated his/her understanding and acceptance.     Dental Advisory Given  Plan Discussed with: Anesthesiologist, CRNA and Surgeon  Anesthesia Plan Comments:         Anesthesia Quick Evaluation

## 2021-03-03 NOTE — Transfer of Care (Signed)
Immediate Anesthesia Transfer of Care Note  Patient: Joseph Mcgee  Procedure(s) Performed: EXAM UNDER ANESTHESIA WITH HEMORRHOIDECTOMY (Anus) COLONOSCOPY WITH PROPOFOL (Anus)  Patient Location: PACU  Anesthesia Type:General  Level of Consciousness: awake, alert  and oriented  Airway & Oxygen Therapy: Patient Spontanous Breathing and Patient connected to face mask oxygen  Post-op Assessment: Report given to RN and Post -op Vital signs reviewed and stable  Post vital signs: Reviewed and stable  Last Vitals:  Vitals Value Taken Time  BP    Temp    Pulse 99 03/03/21 1551  Resp 11 03/03/21 1551  SpO2 97 % 03/03/21 1551  Vitals shown include unvalidated device data.  Last Pain:  Vitals:   03/03/21 1429  TempSrc: Temporal  PainSc: 0-No pain         Complications: No notable events documented.

## 2021-03-03 NOTE — Anesthesia Postprocedure Evaluation (Signed)
Anesthesia Post Note  Patient: [name]  Procedure(s) Performed: EXAM UNDER ANESTHESIA WITH HEMORRHOIDECTOMY (Anus) COLONOSCOPY WITH PROPOFOL (Anus)  Patient location during evaluation: PACU Anesthesia Type: General Level of consciousness: awake and alert Pain management: pain level controlled Vital Signs Assessment: post-procedure vital signs reviewed and stable Respiratory status: spontaneous breathing, nonlabored ventilation and respiratory function stable Cardiovascular status: blood pressure returned to baseline and stable Postop Assessment: no apparent nausea or vomiting Anesthetic complications: no   No notable events documented.   Last Vitals:  Vitals:   03/03/21 1628 03/03/21 1638  BP: 121/77 131/78  Pulse: 86 82  Resp: 20 14  Temp: 36.6 C (!) 36.4 C  SpO2: 100% 98%    Last Pain:  Vitals:   03/03/21 1638  TempSrc: Temporal  PainSc: 0-No pain                 [place] Ouch

## 2021-03-04 ENCOUNTER — Encounter: Payer: Self-pay | Admitting: Surgery

## 2021-03-07 LAB — SURGICAL PATHOLOGY

## 2021-03-08 ENCOUNTER — Other Ambulatory Visit: Payer: Self-pay | Admitting: Surgery

## 2021-03-10 ENCOUNTER — Other Ambulatory Visit: Payer: Medicare Other

## 2021-03-10 NOTE — Progress Notes (Signed)
Tumor Board Documentation  Davontay ZERICK PREVETTE was presented by Dr Lysle Pearl at our Tumor Board on 03/10/2021, which included representatives from medical oncology, pathology, radiology, surgical, pharmacy, pulmonology, genetics, nutrition, research, palliative care, internal medicine, navigation.  Devell currently presents for [facility], for new positive pathology, as an external consult with history of the following treatments: surgical intervention(s).  Additionally, we reviewed previous medical and familial history, history of present illness, and recent lab results along with all available histopathologic and imaging studies. The tumor board considered available treatment options and made the following recommendations:   Will refer to Medical Oncology for further planning for imaging and treatment  The following procedures/referrals were also placed: No orders of the defined types were placed in this encounter.   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  Tumor board is a meeting of clinicians from various specialty areas who evaluate and discuss patients for whom a multidisciplinary approach is being considered. Final determinations in the plan of care are those of the provider(s). The responsibility for follow up of recommendations given during tumor board is that of the provider.   Todays extended care, comprehensive team conference, [place] was not present for the discussion and was not examined.   Multidisciplinary Tumor Board is a multidisciplinary case peer review process.  Decisions discussed in the Multidisciplinary Tumor Board reflect the opinions of the specialists present at the conference without having examined the patient.  Ultimately, treatment and diagnostic decisions rest with the primary provider(s)  and the patient.

## 2021-03-16 ENCOUNTER — Inpatient Hospital Stay: Payer: Medicare Other

## 2021-03-16 ENCOUNTER — Encounter: Payer: Self-pay | Admitting: Oncology

## 2021-03-16 ENCOUNTER — Other Ambulatory Visit: Payer: Self-pay

## 2021-03-16 ENCOUNTER — Inpatient Hospital Stay: Payer: Medicare Other | Attending: Oncology | Admitting: Oncology

## 2021-03-16 VITALS — BP 139/75 | HR 80 | Temp 96.7°F | Resp 18 | Wt 202.4 lb

## 2021-03-16 DIAGNOSIS — E119 Type 2 diabetes mellitus without complications: Secondary | ICD-10-CM | POA: Diagnosis not present

## 2021-03-16 DIAGNOSIS — Z7984 Long term (current) use of oral hypoglycemic drugs: Secondary | ICD-10-CM | POA: Insufficient documentation

## 2021-03-16 DIAGNOSIS — I10 Essential (primary) hypertension: Secondary | ICD-10-CM | POA: Diagnosis not present

## 2021-03-16 DIAGNOSIS — Z79899 Other long term (current) drug therapy: Secondary | ICD-10-CM | POA: Insufficient documentation

## 2021-03-16 DIAGNOSIS — Z7189 Other specified counseling: Secondary | ICD-10-CM | POA: Insufficient documentation

## 2021-03-16 DIAGNOSIS — Z87891 Personal history of nicotine dependence: Secondary | ICD-10-CM | POA: Insufficient documentation

## 2021-03-16 DIAGNOSIS — Z794 Long term (current) use of insulin: Secondary | ICD-10-CM | POA: Diagnosis not present

## 2021-03-16 DIAGNOSIS — C210 Malignant neoplasm of anus, unspecified: Secondary | ICD-10-CM | POA: Diagnosis present

## 2021-03-16 LAB — HIV ANTIBODY (ROUTINE TESTING W REFLEX): HIV Screen 4th Generation wRfx: NONREACTIVE
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
## 2021-03-16 NOTE — Progress Notes (Signed)
Patient here to establish care  

## 2021-03-17 LAB — HEPATITIS PANEL, ACUTE
HCV Ab: NONREACTIVE
Hep A IgM: NONREACTIVE
Hep B C IgM: NONREACTIVE
Hepatitis B Surface Ag: NONREACTIVE

## 2021-03-18 ENCOUNTER — Ambulatory Visit
Admission: RE | Admit: 2021-03-18 | Discharge: 2021-03-18 | Disposition: A | Discharge status: Home / Self Care | Payer: Medicare Other | Source: Ambulatory Visit | Attending: Oncology | Admitting: Oncology

## 2021-03-18 ENCOUNTER — Other Ambulatory Visit: Payer: Self-pay

## 2021-03-18 DIAGNOSIS — C210 Malignant neoplasm of anus, unspecified: Secondary | ICD-10-CM | POA: Diagnosis present

## 2021-03-18 MED ORDER — IOHEXOL 300 MG/ML  SOLN
100.0000 mL | Freq: Once | INTRAMUSCULAR | Status: AC | PRN
  Administered 2021-03-18: 100 mL via INTRAVENOUS

## 2021-03-22 ENCOUNTER — Telehealth: Payer: Self-pay

## 2021-03-22 DIAGNOSIS — C210 Malignant neoplasm of anus, unspecified: Secondary | ICD-10-CM

## 2021-03-22 NOTE — Telephone Encounter (Signed)
Spoke to pt's wife, Letta Median, who states that lung nodule has been followed by yearly CT for the past 2 years.

## 2021-03-22 NOTE — Telephone Encounter (Signed)
-----   Message from Earlie Server, MD sent at 03/21/2021 11:45 PM EST ----- Please let him know that CT showed small lung nodule, nonspecific. Please obtain PET scan initial staging for anal cancer and lung nodule, PET ASAP.  MD follow up 2 days after PET. Thanks.

## 2021-03-30 ENCOUNTER — Other Ambulatory Visit: Payer: Self-pay

## 2021-03-30 ENCOUNTER — Encounter
Admission: RE | Admit: 2021-03-30 | Discharge: 2021-03-30 | Disposition: A | Discharge status: Home / Self Care | Payer: Medicare Other | Source: Ambulatory Visit | Attending: Oncology | Admitting: Oncology

## 2021-03-30 DIAGNOSIS — C210 Malignant neoplasm of anus, unspecified: Secondary | ICD-10-CM | POA: Diagnosis not present

## 2021-03-30 LAB — GLUCOSE, CAPILLARY: Glucose-Capillary: 114 mg/dL — ABNORMAL HIGH (ref 70–99)

## 2021-03-30 MED ORDER — FLUDEOXYGLUCOSE F - 18 (FDG) INJECTION
10.5000 | Freq: Once | INTRAVENOUS | Status: AC | PRN
  Administered 2021-03-30: 11.09 via INTRAVENOUS

## 2021-03-31 ENCOUNTER — Telehealth: Payer: Self-pay | Admitting: *Deleted

## 2021-03-31 NOTE — Telephone Encounter (Signed)
Called report  IMPRESSION: 1. Mild uptake in the anorectal region without evidence of metastatic disease. 2. Mild hypermetabolism involving the anterior right floor of mouth without a definite CT correlate. 3. Moderate left hydronephrosis and left renal edema secondary to a 10 mm proximal left ureteral stone, as on 03/18/2021. These results will be called to the ordering clinician or representative by the Radiologist Assistant, and communication documented in the PACS or [employer]. 4. Tiny right renal stone. 5.  Aortic atherosclerosis (ICD10-I70.0). 6.  Emphysema (ICD10-J43.9).     Electronically Signed   By: Lorin Picket M.D.   On: 03/31/2021 14:37

## 2021-04-01 ENCOUNTER — Encounter: Payer: Self-pay | Admitting: Oncology

## 2021-04-01 ENCOUNTER — Inpatient Hospital Stay: Payer: Medicare Other | Attending: Oncology | Admitting: Oncology

## 2021-04-01 ENCOUNTER — Other Ambulatory Visit: Payer: Self-pay

## 2021-04-01 VITALS — BP 158/91 | HR 75 | Temp 96.3°F | Resp 16 | Ht 69.0 in | Wt 204.3 lb

## 2021-04-01 DIAGNOSIS — Z7189 Other specified counseling: Secondary | ICD-10-CM

## 2021-04-01 DIAGNOSIS — C210 Malignant neoplasm of anus, unspecified: Secondary | ICD-10-CM | POA: Insufficient documentation

## 2021-04-01 DIAGNOSIS — N201 Calculus of ureter: Secondary | ICD-10-CM | POA: Diagnosis not present

## 2021-04-01 MED ORDER — PROCHLORPERAZINE MALEATE 10 MG PO TABS: 10.0000 mg | ORAL_TABLET | Freq: Four times a day (QID) | ORAL | 1 refills | Status: DC | PRN

## 2021-04-01 MED ORDER — LIDOCAINE-PRILOCAINE 2.5-2.5 % EX CREA: TOPICAL_CREAM | CUTANEOUS | 3 refills | Status: DC

## 2021-04-01 MED ORDER — ONDANSETRON HCL 8 MG PO TABS: 8.0000 mg | ORAL_TABLET | Freq: Two times a day (BID) | ORAL | 1 refills | Status: DC | PRN

## 2021-04-01 NOTE — Progress Notes (Signed)
START ON PATHWAY REGIMEN - Anal Carcinoma     One cycle, concurrent with RT:     Fluorouracil      Mitomycin   **Always confirm dose/schedule in your pharmacy ordering system**  Patient Characteristics: Anal Canal Tumors, Newly Diagnosed - Locoregional Disease (Clinical Staging) Therapeutic Status: Newly Diagnosed - Locoregional Disease (Clinical Staging) AJCC T Category: cT2 AJCC N Category: cN0 AJCC M Category: cM0 AJCC 9 Stage Grouping: IIA Check here if patient was staged using an edition other than AJCC Staging 9th Edition: false Intent of Therapy: Curative Intent, Discussed with Patient 

## 2021-04-01 NOTE — Progress Notes (Signed)
Patient on plan of care prior to pathways. 
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
## 2021-04-05 ENCOUNTER — Encounter: Payer: Self-pay | Admitting: Oncology
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
## 2021-04-06 ENCOUNTER — Ambulatory Visit
Admission: RE | Admit: 2021-04-06 | Discharge: 2021-04-06 | Disposition: A | Discharge status: Home / Self Care | Payer: Medicare Other | Attending: Urology | Admitting: Urology

## 2021-04-06 ENCOUNTER — Other Ambulatory Visit: Payer: Self-pay

## 2021-04-06 ENCOUNTER — Ambulatory Visit
Admission: RE | Admit: 2021-04-06 | Discharge: 2021-04-06 | Disposition: A | Discharge status: Home / Self Care | Payer: Medicare Other | Source: Ambulatory Visit | Attending: Radiation Oncology | Admitting: Radiation Oncology

## 2021-04-06 ENCOUNTER — Encounter: Payer: Self-pay | Admitting: Radiation Oncology

## 2021-04-06 ENCOUNTER — Ambulatory Visit: Payer: Medicare Other | Admitting: Urology

## 2021-04-06 ENCOUNTER — Ambulatory Visit
Admission: RE | Admit: 2021-04-06 | Discharge: 2021-04-06 | Disposition: A | Discharge status: Home / Self Care | Payer: Medicare Other | Source: Ambulatory Visit | Attending: Urology | Admitting: Urology

## 2021-04-06 VITALS — BP 159/91 | HR 84 | Ht 69.0 in | Wt 202.0 lb

## 2021-04-06 DIAGNOSIS — Z79899 Other long term (current) drug therapy: Secondary | ICD-10-CM | POA: Diagnosis not present

## 2021-04-06 DIAGNOSIS — Z801 Family history of malignant neoplasm of trachea, bronchus and lung: Secondary | ICD-10-CM | POA: Insufficient documentation

## 2021-04-06 DIAGNOSIS — Z7984 Long term (current) use of oral hypoglycemic drugs: Secondary | ICD-10-CM | POA: Diagnosis not present

## 2021-04-06 DIAGNOSIS — Z87891 Personal history of nicotine dependence: Secondary | ICD-10-CM | POA: Insufficient documentation

## 2021-04-06 DIAGNOSIS — J449 Chronic obstructive pulmonary disease, unspecified: Secondary | ICD-10-CM | POA: Insufficient documentation

## 2021-04-06 DIAGNOSIS — N202 Calculus of kidney with calculus of ureter: Secondary | ICD-10-CM | POA: Insufficient documentation

## 2021-04-06 DIAGNOSIS — Z7982 Long term (current) use of aspirin: Secondary | ICD-10-CM | POA: Insufficient documentation

## 2021-04-06 DIAGNOSIS — Z800 Family history of malignant neoplasm of digestive organs: Secondary | ICD-10-CM | POA: Insufficient documentation

## 2021-04-06 DIAGNOSIS — E119 Type 2 diabetes mellitus without complications: Secondary | ICD-10-CM | POA: Diagnosis not present

## 2021-04-06 DIAGNOSIS — C210 Malignant neoplasm of anus, unspecified: Secondary | ICD-10-CM | POA: Diagnosis not present

## 2021-04-06 DIAGNOSIS — I10 Essential (primary) hypertension: Secondary | ICD-10-CM | POA: Diagnosis not present

## 2021-04-06 DIAGNOSIS — N200 Calculus of kidney: Secondary | ICD-10-CM

## 2021-04-06 LAB — URINALYSIS, COMPLETE
Bilirubin, UA: NEGATIVE
Ketones, UA: NEGATIVE
Leukocytes,UA: NEGATIVE
Nitrite, UA: NEGATIVE
Protein,UA: NEGATIVE
Specific Gravity, UA: 1.02 (ref 1.005–1.030)
Urobilinogen, Ur: 0.2 mg/dL (ref 0.2–1.0)
pH, UA: 5 (ref 5.0–7.5)

## 2021-04-06 LAB — MICROSCOPIC EXAMINATION: Bacteria, UA: NONE SEEN
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
## 2021-04-07 ENCOUNTER — Inpatient Hospital Stay: Payer: Medicare Other

## 2021-04-07 ENCOUNTER — Telehealth: Payer: Self-pay | Admitting: *Deleted

## 2021-04-07 ENCOUNTER — Telehealth: Payer: Self-pay

## 2021-04-07 NOTE — Telephone Encounter (Addendum)
Left Vm with details, asked to return call with any questions.  ----- Message from Hollice Espy, MD sent at 04/07/2021 12:33 PM EST ----- KUB shows the stone well on x-[place]ice.  We should have no trouble focusing the shockwaves on the stone.  Hollice Espy, MD

## 2021-04-07 NOTE — Telephone Encounter (Signed)
Tx is *new*

## 2021-04-07 NOTE — Telephone Encounter (Signed)
Patient scheduled for first XRT on 3/6, per Ana M.   Please schedule patient for lab/MD/5FU/ mitomycin c on 3/6; dc pump D3. Please inform patient of appts.

## 2021-04-08 ENCOUNTER — Other Ambulatory Visit: Payer: Self-pay

## 2021-04-08 DIAGNOSIS — N201 Calculus of ureter: Secondary | ICD-10-CM

## 2021-04-08 NOTE — Progress Notes (Signed)
[facility] ORDER FORM  Expected date of procedure: 04/14/2021  Surgeon: Hollice Espy, MD  Post op standing: 2-4wk follow up w/KUB prior  Anticoagulation/Aspirin/NSAID standing order: Hold all 72 hours prior  Anesthesia standing order: MAC  VTE standing: SCD's  Dx: Left Ureteral Stone  Procedure: left Extracorporeal shock wave lithotripsy  CPT : 76808  Standing Order Set:   *NPO after mn, KUB  *NS 157m/hr, Keflex 5040mPO, Benadryl 2532mO, Valium 69m75m, Zofran 4mg 49m   Medications if other than standing orders:   NONE

## 2021-04-09 LAB — CULTURE, URINE COMPREHENSIVE

## 2021-04-11 NOTE — Telephone Encounter (Signed)
Pt aware of appts. Had concerns of not being contacted about port placement yet. Informed Dr. Tasia Catchings team.

## 2021-04-14 ENCOUNTER — Encounter
Admission: RE | Disposition: A | Discharge status: Home / Self Care | Payer: Self-pay | Source: Ambulatory Visit | Attending: Urology

## 2021-04-14 ENCOUNTER — Ambulatory Visit
Admission: RE | Admit: 2021-04-14 | Discharge: 2021-04-14 | Disposition: A | Discharge status: Home / Self Care | Payer: Medicare Other | Source: Ambulatory Visit | Attending: Urology | Admitting: Urology

## 2021-04-14 ENCOUNTER — Encounter: Payer: Self-pay | Admitting: Urology

## 2021-04-14 ENCOUNTER — Ambulatory Visit: Payer: Medicare Other

## 2021-04-14 ENCOUNTER — Other Ambulatory Visit: Payer: Self-pay

## 2021-04-14 DIAGNOSIS — E119 Type 2 diabetes mellitus without complications: Secondary | ICD-10-CM | POA: Diagnosis not present

## 2021-04-14 DIAGNOSIS — J449 Chronic obstructive pulmonary disease, unspecified: Secondary | ICD-10-CM | POA: Insufficient documentation

## 2021-04-14 DIAGNOSIS — Z85048 Personal history of other malignant neoplasm of rectum, rectosigmoid junction, and anus: Secondary | ICD-10-CM | POA: Insufficient documentation

## 2021-04-14 DIAGNOSIS — I10 Essential (primary) hypertension: Secondary | ICD-10-CM | POA: Insufficient documentation

## 2021-04-14 DIAGNOSIS — Z7982 Long term (current) use of aspirin: Secondary | ICD-10-CM | POA: Insufficient documentation

## 2021-04-14 DIAGNOSIS — N132 Hydronephrosis with renal and ureteral calculous obstruction: Secondary | ICD-10-CM | POA: Diagnosis present

## 2021-04-14 DIAGNOSIS — Z87891 Personal history of nicotine dependence: Secondary | ICD-10-CM | POA: Diagnosis not present

## 2021-04-14 DIAGNOSIS — N201 Calculus of ureter: Secondary | ICD-10-CM

## 2021-04-14 HISTORY — PX: EXTRACORPOREAL SHOCK WAVE LITHOTRIPSY: SHX1557

## 2021-04-14 SURGERY — LITHOTRIPSY, ESWL
Anesthesia: Moderate Sedation | Laterality: Left

## 2021-04-14 MED ORDER — DIAZEPAM 5 MG PO TABS
ORAL_TABLET | ORAL | Status: AC
  Administered 2021-04-14: 10 mg via ORAL
  Filled 2021-04-14: qty 2

## 2021-04-14 MED ORDER — ONDANSETRON HCL 4 MG/2ML IJ SOLN: 4.0000 mg | Freq: Once | INTRAMUSCULAR | Status: AC

## 2021-04-14 MED ORDER — CEPHALEXIN 500 MG PO CAPS
ORAL_CAPSULE | ORAL | Status: AC
  Administered 2021-04-14: 500 mg via ORAL
  Filled 2021-04-14: qty 1

## 2021-04-14 MED ORDER — DIPHENHYDRAMINE HCL 25 MG PO CAPS: 25.0000 mg | ORAL_CAPSULE | ORAL | Status: AC

## 2021-04-14 MED ORDER — DIPHENHYDRAMINE HCL 25 MG PO CAPS
ORAL_CAPSULE | ORAL | Status: AC
  Administered 2021-04-14: 25 mg via ORAL
  Filled 2021-04-14: qty 1

## 2021-04-14 MED ORDER — HYDROCODONE-ACETAMINOPHEN 5-325 MG PO TABS: 1.0000 | ORAL_TABLET | Freq: Four times a day (QID) | ORAL | 0 refills | Status: DC | PRN

## 2021-04-14 MED ORDER — DIAZEPAM 5 MG PO TABS: 10.0000 mg | ORAL_TABLET | ORAL | Status: AC

## 2021-04-14 MED ORDER — CEPHALEXIN 500 MG PO CAPS: 500.0000 mg | ORAL_CAPSULE | Freq: Once | ORAL | Status: AC

## 2021-04-14 MED ORDER — TAMSULOSIN HCL 0.4 MG PO CAPS
0.4000 mg | ORAL_CAPSULE | Freq: Every day | ORAL | 0 refills | Status: DC
Start: 2021-04-14 — End: 2021-09-21

## 2021-04-14 MED ORDER — SODIUM CHLORIDE 0.9 % IV SOLN: INTRAVENOUS | Status: DC

## 2021-04-14 MED ORDER — ONDANSETRON HCL 4 MG/2ML IJ SOLN
INTRAMUSCULAR | Status: AC
  Administered 2021-04-14: 4 mg via INTRAVENOUS
  Filled 2021-04-14: qty 2

## 2021-04-14 NOTE — Discharge Instructions (Signed)
AMBULATORY SURGERY  ?DISCHARGE INSTRUCTIONS ? ? ?The drugs that you were given will stay in your system until tomorrow so for the next 24 hours you should not: ? ?Drive an automobile ?Make any legal decisions ?Drink any alcoholic beverage ? ? ?You may resume regular meals tomorrow.  Today it is better to start with liquids and gradually work up to solid foods. ? ?You may eat anything you prefer, but it is better to start with liquids, then soup and crackers, and gradually work up to solid foods. ? ? ?Please notify your doctor immediately if you have any unusual bleeding, trouble breathing, redness and pain at the surgery site, drainage, fever, or pain not relieved by medication. ? ? ? ?Additional Instructions: ? ? ? ?Please contact your physician with any problems or Same Day Surgery at [phone], Monday through Friday 6 am to 4 pm, or [place] [facility] [place] [facility] number at [phone].  ?

## 2021-04-14 NOTE — Interval H&P Note (Signed)
History and Physical Interval Note:  04/14/2021 10:08 AM  Joseph Mcgee  has presented today for surgery, with the diagnosis of Left Ureteral Stone.  The various methods of treatment have been discussed with the patient and family. After consideration of risks, benefits and other options for treatment, the patient has consented to  Procedure(s): EXTRACORPOREAL SHOCK WAVE LITHOTRIPSY ([facility]) (Left) as a surgical intervention.  The patient's history has been reviewed, patient examined, no change in status, stable for surgery.  I have reviewed the patient's chart and labs.  Questions were answered to the patient's satisfaction.     Hollice Espy

## 2021-04-15 ENCOUNTER — Ambulatory Visit: Payer: Self-pay | Admitting: Surgery

## 2021-04-15 ENCOUNTER — Encounter
Admission: RE | Admit: 2021-04-15 | Discharge: 2021-04-15 | Disposition: A | Discharge status: Home / Self Care | Payer: Medicare Other | Source: Ambulatory Visit | Attending: Surgery | Admitting: Surgery

## 2021-04-15 ENCOUNTER — Other Ambulatory Visit: Payer: Self-pay

## 2021-04-15 VITALS — Ht 69.0 in | Wt 201.9 lb

## 2021-04-15 DIAGNOSIS — N201 Calculus of ureter: Secondary | ICD-10-CM

## 2021-04-15 DIAGNOSIS — Z01812 Encounter for preprocedural laboratory examination: Secondary | ICD-10-CM

## 2021-04-15 DIAGNOSIS — N200 Calculus of kidney: Secondary | ICD-10-CM

## 2021-04-15 HISTORY — DX: Personal history of urinary calculi: Z87.442

## 2021-04-15 HISTORY — DX: Gastro-esophageal reflux disease without esophagitis: K21.9

## 2021-04-15 HISTORY — DX: Pure hypercholesterolemia, unspecified: E78.00

## 2021-04-15 HISTORY — DX: Malignant neoplasm of anus, unspecified: C21.0

## 2021-04-15 HISTORY — DX: Cortical age-related cataract, unspecified eye: H25.019

## 2021-04-15 HISTORY — DX: Atherosclerosis of aorta: I70.0

## 2021-04-15 NOTE — H&P (Signed)
Subjective:  CC: Anal discharge [R19.8]   HPI: Joseph Mcgee is a 66 y.o. male who was referrred by Dion Body, MD for above. Symptoms were first noted several months ago. he has some rectal bleeding occurring a few times per week. Bleeding is described as scant, mixed with yellow discharge. Pain is dull and intermittent, confined to the perianal area, without radiation. Associated with nothing specific, exacerbated by nothing specific.  Feels noticeable lump in the area with drainage, then decreases.  Past Medical History: has a past medical history of Arthritis, Cataract cortical, senile, Erectile dysfunction, Essential hypertension, GERD (gastroesophageal reflux disease), Pneumonia due to COVID-19 virus (02/2020), Pure hypercholesterolemia, and Type 2 diabetes mellitus (CMS-HCC).  Past Surgical History: has a past surgical history that includes Bladder surgery, 6010; Right umbilical hernia repair and inguinal hernia repair, 05/18/10; Tonsillectomy (10/29/1957); and Hernia repair.  Family History: family history includes Alzheimer's disease in his maternal aunt and mother; Coronary Artery Disease (Blocked arteries around heart) in his father; Dementia in his mother; Diabetes type II in his mother; High blood pressure (Hypertension) in his father, mother, and sister; No Known Problems in his sister; Obesity in his mother and sister; Stroke in his father and paternal grandmother.  Social History: reports that he quit smoking about 8 years ago. His smoking use included cigarettes. He started smoking about 49 years ago. He has a 41.00 pack-year smoking history. He has never used smokeless tobacco. He reports that he does not drink alcohol and does not use drugs.  Current Medications: has a current medication list which includes the following prescription(s): ascorbic acid (vitamin c), aspirin, contour next test strips, toujeo solostar u-300 insulin, cyanocobalamin, dapagliflozin, ezetimibe,  fluticasone propionate, hydrochlorothiazide, insulin lispro, lancets, losartan, magnesium, metformin, omega-3 fatty acids-fish oil, semaglutide, unifine pentips, and zinc.  Allergies:  Allergies as of 12/22/2020 - Reviewed 11/10/2020  Allergen Reaction Noted   Lisinopril Cough 04/25/2017   Lovastatin Muscle Pain 09/29/2013   Vasotec [enalapril maleate] Cough 09/29/2013   ROS:  A 15 point review of systems was performed and pertinent positives and negatives noted in HPI  Objective:    There were no vitals taken for this visit.  Constitutional : No distress, cooperative, alert  Lymphatics/Throat: Supple with no lymphadenopathy  Respiratory: Clear to auscultation bilaterally  Cardiovascular: Regular rate and rhythm  Gastrointestinal: Soft, non-tender, non-distended, no organomegaly.  Musculoskeletal: Steady gait and movement  Skin: Cool and moist, no surgical scars  Psychiatric: Normal affect, non-agitated, not confused  Rectal: External exam normal. DRE noted some fullness with slightly tighter sphincter tone, further visualization of the area noted a possible ulceration at the right lateral aspect with no active bleeding or discharge. right at anal verge with more TTP compared to other areas. Anoscopy aborted due to discomfort    LABS:  n/a   RADS:  Assessment:    Anal discharge [R19.8] source unclear but possible ulcerated lesion noted slightly proximal to anal verge on limited bedside exam. Pt also overdue for screening colonoscopy, so recommended EUA and colonoscopy at same time.  Plan:   1. Anal discharge [R19.8] underwent colonoscopy and EUA, with path showing anal cancer requiring chemo.  Risk alternative benefits discussed.  Risks include bleeding, infection, dislocation, migration, malfunction, unsuccessful placement, pneumothorax, and additional procedures to address that risks.  Benefits include initiation of chemotherapy.  Alternative includes non-IV infusion  therapy.  We discussed the need for the port to infuse IV chemotherapy agents, and how the port can be a temporary  and/or permanent option depending on patient preference after completion of chemotherapy.  Port will be okay to use as soon as it is placed.  Patient verbalized understanding and all questions and concerns addressed.

## 2021-04-15 NOTE — H&P (View-Only) (Signed)
Subjective:  CC: Anal discharge [R19.8]   HPI: Joseph Mcgee is a 66 y.o. male who was referrred by Dion Body, MD for above. Symptoms were first noted several months ago. he has some rectal bleeding occurring a few times per week. Bleeding is described as scant, mixed with yellow discharge. Pain is dull and intermittent, confined to the perianal area, without radiation. Associated with nothing specific, exacerbated by nothing specific.  Feels noticeable lump in the area with drainage, then decreases.  Past Medical History: has a past medical history of Arthritis, Cataract cortical, senile, Erectile dysfunction, Essential hypertension, GERD (gastroesophageal reflux disease), Pneumonia due to COVID-19 virus (02/2020), Pure hypercholesterolemia, and Type 2 diabetes mellitus (CMS-HCC).  Past Surgical History: has a past surgical history that includes Bladder surgery, 7322; Right umbilical hernia repair and inguinal hernia repair, 05/18/10; Tonsillectomy (10/29/1957); and Hernia repair.  Family History: family history includes Alzheimer's disease in his maternal aunt and mother; Coronary Artery Disease (Blocked arteries around heart) in his father; Dementia in his mother; Diabetes type II in his mother; High blood pressure (Hypertension) in his father, mother, and sister; No Known Problems in his sister; Obesity in his mother and sister; Stroke in his father and paternal grandmother.  Social History: reports that he quit smoking about 8 years ago. His smoking use included cigarettes. He started smoking about 49 years ago. He has a 41.00 pack-year smoking history. He has never used smokeless tobacco. He reports that he does not drink alcohol and does not use drugs.  Current Medications: has a current medication list which includes the following prescription(s): ascorbic acid (vitamin c), aspirin, contour next test strips, toujeo solostar u-300 insulin, cyanocobalamin, dapagliflozin, ezetimibe,  fluticasone propionate, hydrochlorothiazide, insulin lispro, lancets, losartan, magnesium, metformin, omega-3 fatty acids-fish oil, semaglutide, unifine pentips, and zinc.  Allergies:  Allergies as of 12/22/2020 - Reviewed 11/10/2020  Allergen Reaction Noted   Lisinopril Cough 04/25/2017   Lovastatin Muscle Pain 09/29/2013   Vasotec [enalapril maleate] Cough 09/29/2013   ROS:  A 15 point review of systems was performed and pertinent positives and negatives noted in HPI  Objective:    There were no vitals taken for this visit.  Constitutional : No distress, cooperative, alert  Lymphatics/Throat: Supple with no lymphadenopathy  Respiratory: Clear to auscultation bilaterally  Cardiovascular: Regular rate and rhythm  Gastrointestinal: Soft, non-tender, non-distended, no organomegaly.  Musculoskeletal: Steady gait and movement  Skin: Cool and moist, no surgical scars  Psychiatric: Normal affect, non-agitated, not confused  Rectal: External exam normal. DRE noted some fullness with slightly tighter sphincter tone, further visualization of the area noted a possible ulceration at the right lateral aspect with no active bleeding or discharge. right at anal verge with more TTP compared to other areas. Anoscopy aborted due to discomfort    LABS:  n/a   RADS:  Assessment:    Anal discharge [R19.8] source unclear but possible ulcerated lesion noted slightly proximal to anal verge on limited bedside exam. Pt also overdue for screening colonoscopy, so recommended EUA and colonoscopy at same time.  Plan:   1. Anal discharge [R19.8] underwent colonoscopy and EUA, with path showing anal cancer requiring chemo.  Risk alternative benefits discussed.  Risks include bleeding, infection, dislocation, migration, malfunction, unsuccessful placement, pneumothorax, and additional procedures to address that risks.  Benefits include initiation of chemotherapy.  Alternative includes non-IV infusion  therapy.  We discussed the need for the port to infuse IV chemotherapy agents, and how the port can be a temporary  and/or permanent option depending on patient preference after completion of chemotherapy.  Port will be okay to use as soon as it is placed.  Patient verbalized understanding and all questions and concerns addressed.

## 2021-04-15 NOTE — Patient Instructions (Addendum)
Your procedure is scheduled on: Wednesday, March 1 Report to the Registration Desk on the 1st floor of the [facility]. To find out your arrival time, please call [phone] between 1PM - 3PM on: Tuesday, February 28  REMEMBER: Instructions that are not followed completely may result in serious medical risk, up to and including death; or upon the discretion of your surgeon and anesthesiologist your surgery may need to be rescheduled.  Do not eat food after midnight the night before surgery.  No gum chewing, lozengers or hard candies.  You may however, drink water up to 2 hours before you are scheduled to arrive for your surgery. Do not drink anything within 2 hours of your scheduled arrival time.  TAKE THESE MEDICATIONS THE MORNING OF SURGERY WITH A SIP OF WATER:  Ezetimibe (Zetia) Tamsulosin (Flomax)  Farxiga:  stop 3 days prior to surgery. Last day to take Wilder Glade is Saturday, February 25. Resume AFTER surgery.  Metformin: stop 2 days prior to surgery. Last day to take METFORMIN is Sunday, February 26. Resume AFTER surgery.  Toujeo insulin: only take 1/2 of your dose (35 units) the night before surgery.  Do NOT take ANY insulin on the morning of surgery.  One week prior to surgery: starting February 22 Stop aspirin and Anti-inflammatories (NSAIDS) such as Advil, Aleve, Ibuprofen, Motrin, Naproxen, Naprosyn and Aspirin based products such as Excedrin, Goodys Powder, BC Powder. Stop ANY OVER THE COUNTER supplements until after surgery. Stop Vitamin D, fish oil, vitamin C, zinc You may however, continue to take Tylenol if needed for pain up until the day of surgery.  No Alcohol for 24 hours before or after surgery.  No Smoking including e-cigarettes for 24 hours prior to surgery.  No chewable tobacco products for at least 6 hours prior to surgery.  No nicotine patches on the day of surgery.  Do not use any "recreational" drugs for at least a week prior to your surgery.   Please be advised that the combination of cocaine and anesthesia may have negative outcomes, up to and including death. If you test positive for cocaine, your surgery will be cancelled.  On the morning of surgery brush your teeth with toothpaste and water, you may rinse your mouth with mouthwash if you wish. Do not swallow any toothpaste or mouthwash.  Use CHG Soap as directed on instruction sheet.  Do not wear jewelry, make-up, hairpins, clips or nail polish.  Do not wear lotions, powders, or perfumes.   Do not shave body from the neck down 48 hours prior to surgery just in case you cut yourself which could leave a site for infection.  Also, freshly shaved skin may become irritated if using the CHG soap.  Contact lenses, hearing aids and dentures may not be worn into surgery.  Do not bring valuables to the hospital. [facility] is not responsible for any missing/lost belongings or valuables.   Notify your doctor if there is any change in your medical condition (cold, fever, infection).  Wear comfortable clothing (specific to your surgery type) to the hospital.  After surgery, you can help prevent lung complications by doing breathing exercises.  Take deep breaths and cough every 1-2 hours. Your doctor may order a device called an Incentive Spirometer to help you take deep breaths.  If you are being discharged the day of surgery, you will not be allowed to drive home. You will need a responsible adult (18 years or older) to drive you home and stay with  you that night.   If you are taking public transportation, you will need to have a responsible adult (18 years or older) with you. Please confirm with your physician that it is acceptable to use public transportation.   Please call the [facility] at [phone] if you have any questions about these instructions.  Surgery Visitation Policy:  Patients undergoing a surgery or procedure may have one family member  or support person with them as long as that person is not COVID-19 positive or experiencing its symptoms.  That person may remain in the waiting area during the procedure and may rotate out with other people.

## 2021-04-18 ENCOUNTER — Encounter
Admission: RE | Admit: 2021-04-18 | Discharge: 2021-04-18 | Disposition: A | Discharge status: Home / Self Care | Payer: Medicare Other | Source: Ambulatory Visit | Attending: Surgery | Admitting: Surgery

## 2021-04-18 ENCOUNTER — Other Ambulatory Visit: Payer: Self-pay

## 2021-04-18 ENCOUNTER — Encounter: Payer: Self-pay | Admitting: Urgent Care

## 2021-04-18 ENCOUNTER — Ambulatory Visit
Admission: RE | Admit: 2021-04-18 | Discharge: 2021-04-18 | Disposition: A | Discharge status: Home / Self Care | Payer: Medicare Other | Source: Ambulatory Visit | Attending: Radiation Oncology | Admitting: Radiation Oncology

## 2021-04-18 DIAGNOSIS — C210 Malignant neoplasm of anus, unspecified: Secondary | ICD-10-CM | POA: Diagnosis present

## 2021-04-18 DIAGNOSIS — Z01812 Encounter for preprocedural laboratory examination: Secondary | ICD-10-CM | POA: Diagnosis not present

## 2021-04-18 DIAGNOSIS — C775 Secondary and unspecified malignant neoplasm of intrapelvic lymph nodes: Secondary | ICD-10-CM | POA: Insufficient documentation

## 2021-04-18 LAB — POTASSIUM: Potassium: 3.9 mmol/L (ref 3.5–5.1)

## 2021-04-18 NOTE — Progress Notes (Signed)
Pharmacist Chemotherapy Monitoring - Initial Assessment   ? ?Anticipated start date: 04/25/21  ? ?The following has been reviewed per standard work regarding the patient's treatment regimen: ?The patient's diagnosis, treatment plan and drug doses, and organ/hematologic function ?Lab orders and baseline tests specific to treatment regimen  ?The treatment plan start date, drug sequencing, and pre-medications ?Prior authorization status  ?Patient's documented medication list, including drug-drug interaction screen and prescriptions for anti-emetics and supportive care specific to the treatment regimen ?The drug concentrations, fluid compatibility, administration routes, and timing of the medications to be used ?The patient's access for treatment and lifetime cumulative dose history, if applicable  ?The patient's medication allergies and previous infusion related reactions, if applicable  ? ?Changes made to treatment plan:  ?treatment plan date ? ?Follow up needed:  ?signing treatment plan ? ? ?Adelina Mings, [facility], ?04/18/2021  11:08 AM  ?

## 2021-04-20 ENCOUNTER — Other Ambulatory Visit: Payer: Self-pay

## 2021-04-20 ENCOUNTER — Encounter: Payer: Self-pay | Admitting: Surgery

## 2021-04-20 ENCOUNTER — Ambulatory Visit
Admission: RE | Admit: 2021-04-20 | Discharge: 2021-04-20 | Disposition: A | Discharge status: Home / Self Care | Payer: Medicare Other | Source: Ambulatory Visit | Attending: Surgery | Admitting: Surgery

## 2021-04-20 ENCOUNTER — Ambulatory Visit: Payer: Medicare Other

## 2021-04-20 ENCOUNTER — Ambulatory Visit: Payer: Medicare Other | Admitting: Anesthesiology

## 2021-04-20 ENCOUNTER — Encounter
Admission: RE | Disposition: A | Discharge status: Home / Self Care | Payer: Self-pay | Source: Ambulatory Visit | Attending: Surgery

## 2021-04-20 DIAGNOSIS — Z87891 Personal history of nicotine dependence: Secondary | ICD-10-CM | POA: Diagnosis not present

## 2021-04-20 DIAGNOSIS — E119 Type 2 diabetes mellitus without complications: Secondary | ICD-10-CM | POA: Insufficient documentation

## 2021-04-20 DIAGNOSIS — C210 Malignant neoplasm of anus, unspecified: Secondary | ICD-10-CM | POA: Insufficient documentation

## 2021-04-20 DIAGNOSIS — Z95828 Presence of other vascular implants and grafts: Secondary | ICD-10-CM

## 2021-04-20 DIAGNOSIS — I10 Essential (primary) hypertension: Secondary | ICD-10-CM | POA: Insufficient documentation

## 2021-04-20 DIAGNOSIS — Z7984 Long term (current) use of oral hypoglycemic drugs: Secondary | ICD-10-CM | POA: Insufficient documentation

## 2021-04-20 DIAGNOSIS — Z7985 Long-term (current) use of injectable non-insulin antidiabetic drugs: Secondary | ICD-10-CM | POA: Insufficient documentation

## 2021-04-20 DIAGNOSIS — J449 Chronic obstructive pulmonary disease, unspecified: Secondary | ICD-10-CM | POA: Diagnosis not present

## 2021-04-20 DIAGNOSIS — K219 Gastro-esophageal reflux disease without esophagitis: Secondary | ICD-10-CM | POA: Insufficient documentation

## 2021-04-20 HISTORY — PX: PORTACATH PLACEMENT: SHX2246

## 2021-04-20 LAB — GLUCOSE, CAPILLARY
Glucose-Capillary: 226 mg/dL — ABNORMAL HIGH (ref 70–99)
Glucose-Capillary: 194 mg/dL — ABNORMAL HIGH (ref 70–99)

## 2021-04-20 SURGERY — INSERTION, TUNNELED CENTRAL VENOUS DEVICE, WITH PORT
Anesthesia: General | Site: Chest | Laterality: Right

## 2021-04-20 MED ORDER — HYDROCODONE-ACETAMINOPHEN 5-325 MG PO TABS: 1.0000 | ORAL_TABLET | Freq: Four times a day (QID) | ORAL | 0 refills | Status: DC | PRN

## 2021-04-20 MED ORDER — SODIUM CHLORIDE (PF) 0.9 % IJ SOLN
INTRAMUSCULAR | Status: DC | PRN
  Administered 2021-04-20: 500 mL

## 2021-04-20 MED ORDER — CEFAZOLIN SODIUM-DEXTROSE 2-4 GM/100ML-% IV SOLN
2.0000 g | INTRAVENOUS | Status: AC
  Administered 2021-04-20: 2 g via INTRAVENOUS

## 2021-04-20 MED ORDER — CHLORHEXIDINE GLUCONATE CLOTH 2 % EX PADS
6.0000 | MEDICATED_PAD | Freq: Once | CUTANEOUS | Status: AC
  Administered 2021-04-20: 6 via TOPICAL

## 2021-04-20 MED ORDER — MIDAZOLAM HCL 2 MG/2ML IJ SOLN
INTRAMUSCULAR | Status: AC
  Filled 2021-04-20: qty 2

## 2021-04-20 MED ORDER — FAMOTIDINE 20 MG PO TABS
ORAL_TABLET | ORAL | Status: AC
  Administered 2021-04-20: 20 mg via ORAL
  Filled 2021-04-20: qty 1

## 2021-04-20 MED ORDER — LIDOCAINE HCL (PF) 2 % IJ SOLN
INTRAMUSCULAR | Status: AC
  Filled 2021-04-20: qty 5

## 2021-04-20 MED ORDER — ACETAMINOPHEN 325 MG PO TABS
650.0000 mg | ORAL_TABLET | Freq: Three times a day (TID) | ORAL | 0 refills | Status: DC | PRN
Start: 2021-04-20 — End: 2021-05-02

## 2021-04-20 MED ORDER — CHLORHEXIDINE GLUCONATE 0.12 % MT SOLN: 15.0000 mL | Freq: Once | OROMUCOSAL | Status: AC

## 2021-04-20 MED ORDER — BUPIVACAINE HCL (PF) 0.5 % IJ SOLN
INTRAMUSCULAR | Status: AC
  Filled 2021-04-20: qty 30

## 2021-04-20 MED ORDER — MIDAZOLAM HCL 2 MG/2ML IJ SOLN
INTRAMUSCULAR | Status: DC | PRN
  Administered 2021-04-20: 2 mg via INTRAVENOUS

## 2021-04-20 MED ORDER — FENTANYL CITRATE (PF) 100 MCG/2ML IJ SOLN
INTRAMUSCULAR | Status: DC | PRN
  Administered 2021-04-20 (×2): 25 ug via INTRAVENOUS

## 2021-04-20 MED ORDER — LIDOCAINE HCL (CARDIAC) PF 100 MG/5ML IV SOSY
PREFILLED_SYRINGE | INTRAVENOUS | Status: DC | PRN
Start: 2021-04-20 — End: 2021-04-20
  Administered 2021-04-20: 80 mg via INTRAVENOUS

## 2021-04-20 MED ORDER — HEPARIN SOD (PORK) LOCK FLUSH 100 UNIT/ML IV SOLN
INTRAVENOUS | Status: AC
  Filled 2021-04-20: qty 5

## 2021-04-20 MED ORDER — ONDANSETRON HCL 4 MG/2ML IJ SOLN
INTRAMUSCULAR | Status: AC
  Filled 2021-04-20: qty 2

## 2021-04-20 MED ORDER — SODIUM CHLORIDE 0.9 % IV SOLN: INTRAVENOUS | Status: DC

## 2021-04-20 MED ORDER — PHENYLEPHRINE 40 MCG/ML (10ML) SYRINGE FOR IV PUSH (FOR BLOOD PRESSURE SUPPORT)
PREFILLED_SYRINGE | INTRAVENOUS | Status: DC | PRN
Start: 2021-04-20 — End: 2021-04-20
  Administered 2021-04-20 (×2): 80 ug via INTRAVENOUS
  Administered 2021-04-20 (×2): 120 ug via INTRAVENOUS

## 2021-04-20 MED ORDER — LIDOCAINE-EPINEPHRINE 1 %-1:100000 IJ SOLN
INTRAMUSCULAR | Status: AC
  Filled 2021-04-20: qty 1

## 2021-04-20 MED ORDER — ACETAMINOPHEN 10 MG/ML IV SOLN: 1000.0000 mg | Freq: Once | INTRAVENOUS | Status: DC | PRN

## 2021-04-20 MED ORDER — ONDANSETRON HCL 4 MG/2ML IJ SOLN: 4.0000 mg | Freq: Once | INTRAMUSCULAR | Status: DC | PRN

## 2021-04-20 MED ORDER — IBUPROFEN 800 MG PO TABS: 800.0000 mg | ORAL_TABLET | Freq: Three times a day (TID) | ORAL | 0 refills | Status: DC | PRN

## 2021-04-20 MED ORDER — FENTANYL CITRATE (PF) 100 MCG/2ML IJ SOLN: 25.0000 ug | INTRAMUSCULAR | Status: DC | PRN

## 2021-04-20 MED ORDER — ORAL CARE MOUTH RINSE: 15.0000 mL | Freq: Once | OROMUCOSAL | Status: AC

## 2021-04-20 MED ORDER — INSULIN ASPART 100 UNIT/ML IJ SOLN
INTRAMUSCULAR | Status: AC
  Administered 2021-04-20: 5 [IU] via SUBCUTANEOUS
  Filled 2021-04-20: qty 1

## 2021-04-20 MED ORDER — FAMOTIDINE 20 MG PO TABS
20.0000 mg | ORAL_TABLET | Freq: Once | ORAL | Status: AC
Start: 2021-04-20 — End: 2021-04-20

## 2021-04-20 MED ORDER — LIDOCAINE-EPINEPHRINE 1 %-1:100000 IJ SOLN
INTRAMUSCULAR | Status: DC | PRN
  Administered 2021-04-20: 13 mL

## 2021-04-20 MED ORDER — PROPOFOL 500 MG/50ML IV EMUL
INTRAVENOUS | Status: AC
  Filled 2021-04-20: qty 50

## 2021-04-20 MED ORDER — LACTATED RINGERS IV SOLN: INTRAVENOUS | Status: DC

## 2021-04-20 MED ORDER — CHLORHEXIDINE GLUCONATE 0.12 % MT SOLN
OROMUCOSAL | Status: AC
Start: 2021-04-20 — End: 2021-04-20
  Administered 2021-04-20: 15 mL via OROMUCOSAL
  Filled 2021-04-20: qty 15

## 2021-04-20 MED ORDER — ONDANSETRON HCL 4 MG/2ML IJ SOLN
INTRAMUSCULAR | Status: DC | PRN
Start: 2021-04-20 — End: 2021-04-20
  Administered 2021-04-20: 4 mg via INTRAVENOUS

## 2021-04-20 MED ORDER — INSULIN ASPART 100 UNIT/ML IJ SOLN: 5.0000 [IU] | Freq: Once | INTRAMUSCULAR | Status: AC

## 2021-04-20 MED ORDER — CEFAZOLIN SODIUM-DEXTROSE 2-4 GM/100ML-% IV SOLN
INTRAVENOUS | Status: AC
  Filled 2021-04-20: qty 100

## 2021-04-20 MED ORDER — PROPOFOL 10 MG/ML IV BOLUS
INTRAVENOUS | Status: DC | PRN
  Administered 2021-04-20: 120 mg via INTRAVENOUS

## 2021-04-20 MED ORDER — HEPARIN SOD (PORK) LOCK FLUSH 100 UNIT/ML IV SOLN
INTRAVENOUS | Status: DC | PRN
  Administered 2021-04-20: 500 [IU]

## 2021-04-20 MED ORDER — EPHEDRINE SULFATE (PRESSORS) 50 MG/ML IJ SOLN
INTRAMUSCULAR | Status: DC | PRN
  Administered 2021-04-20: 10 mg via INTRAVENOUS

## 2021-04-20 MED ORDER — FENTANYL CITRATE (PF) 100 MCG/2ML IJ SOLN
INTRAMUSCULAR | Status: AC
  Filled 2021-04-20: qty 2

## 2021-04-20 SURGICAL SUPPLY — 33 items
BAG DECANTER FOR FLEXI CONT (MISCELLANEOUS) ×2 IMPLANT
BENZOIN TINCTURE PRP APPL 2/3 (GAUZE/BANDAGES/DRESSINGS) ×2 IMPLANT
BLADE SURG SZ11 CARB STEEL (BLADE) ×2 IMPLANT
BOOT SUTURE AID YELLOW STND (SUTURE) ×2 IMPLANT
CHLORAPREP W/TINT 26 (MISCELLANEOUS) ×2 IMPLANT
COVER LIGHT HANDLE STERIS (MISCELLANEOUS) ×4 IMPLANT
DRAPE C-ARM XRAY 36X54 (DRAPES) ×2 IMPLANT
DRSG TEGADERM 4X4.75 (GAUZE/BANDAGES/DRESSINGS) ×2 IMPLANT
ELECT CAUTERY BLADE 6.4 (BLADE) ×2 IMPLANT
ELECT REM PT RETURN 9FT ADLT (ELECTROSURGICAL) ×2
ELECTRODE REM PT RTRN 9FT ADLT (ELECTROSURGICAL) ×1 IMPLANT
GAUZE 4X4 16PLY ~~LOC~~+RFID DBL (SPONGE) ×2 IMPLANT
GLOVE SURG SYN 6.5 ES PF (GLOVE) ×2 IMPLANT
GLOVE SURG SYN 6.5 PF PI (GLOVE) ×1 IMPLANT
GLOVE SURG UNDER POLY LF SZ7 (GLOVE) ×2 IMPLANT
GOWN STRL REUS W/ TWL LRG LVL3 (GOWN DISPOSABLE) ×2 IMPLANT
GOWN STRL REUS W/TWL LRG LVL3 (GOWN DISPOSABLE) ×2
IV NS 500ML (IV SOLUTION) ×1
IV NS 500ML BAXH (IV SOLUTION) ×1 IMPLANT
KIT PORT POWER 8FR ISP CVUE (Port) ×2 IMPLANT
KIT TURNOVER KIT A (KITS) ×2 IMPLANT
LABEL OR SOLS (LABEL) ×2 IMPLANT
MANIFOLD NEPTUNE II (INSTRUMENTS) ×2 IMPLANT
PACK PORT-A-CATH (MISCELLANEOUS) ×2 IMPLANT
SPIKE FLUID TRANSFER (MISCELLANEOUS) ×3 IMPLANT
SPONGE VERSALON 4X4 4PLY (MISCELLANEOUS) ×2 IMPLANT
STRIP CLOSURE SKIN 1/2X4 (GAUZE/BANDAGES/DRESSINGS) ×2 IMPLANT
SUT MNCRL AB 4-0 PS2 18 (SUTURE) ×2 IMPLANT
SUT PROLENE 2 0 SH DA (SUTURE) ×2 IMPLANT
SUT VIC AB 3-0 SH 27 (SUTURE) ×1
SUT VIC AB 3-0 SH 27X BRD (SUTURE) ×1 IMPLANT
SYR 10ML LL (SYRINGE) ×2 IMPLANT
WATER STERILE IRR 500ML POUR (IV SOLUTION) ×1 IMPLANT

## 2021-04-20 NOTE — Anesthesia Preprocedure Evaluation (Addendum)
Anesthesia Evaluation  ?Patient identified by MRN, date of birth, ID band ?Patient awake ? ? ? ?Reviewed: ?Allergy & Precautions, NPO status , Patient's Chart, lab work & pertinent test results ? ?History of Anesthesia Complications ?Negative for: history of anesthetic complications ? ?Airway ?Mallampati: III ? ? ?Neck ROM: Full ? ? ? Dental ?no notable dental hx. ? ?  ?Pulmonary ?COPD, former smoker (quit 2014),  ?  ?Pulmonary exam normal ?breath sounds clear to auscultation ? ? ? ? ? ? Cardiovascular ?hypertension, Normal cardiovascular exam ?Rhythm:Regular Rate:Normal ? 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  ?Neuro/Psych ?negative neurological ROS ?   ? GI/Hepatic ?GERD  ,Anal SCC ?  ?Endo/Other  ?diabetes, Type 2 ? Renal/GU ?Renal disease (nephrolithiasis)  ? ?  ?Musculoskeletal ? ?(+) Arthritis ,  ? Abdominal ?  ?Peds ? Hematology ?negative hematology ROS ?(+)   ?Anesthesia Other Findings ? ? Reproductive/Obstetrics ? ?  ? ? ? ? ? ? ? ? ? ? ? ? ? ?  ?  ? ? ? ? ? ? ? ?Anesthesia Physical ?Anesthesia Plan ? ?ASA: 3 ? ?Anesthesia Plan: General  ? ?Post-op Pain Management:   ? ?Induction: Intravenous ? ?PONV Risk Score and Plan: 2 and Ondansetron and Treatment may vary due to age or medical condition ? ?Airway Management Planned: LMA ? ?Additional Equipment:  ? ?Intra-op Plan:  ? ?Post-operative Plan: Extubation in OR ? ?Informed Consent: I have reviewed the patients History and Physical, chart, labs and discussed the procedure including the risks, benefits and alternatives for the proposed anesthesia with the patient or authorized representative who has indicated his/her understanding and acceptance.  ? ? ? ?Dental advisory given ? ?Plan Discussed with: CRNA ? ?Anesthesia Plan Comments: (Patient consented for risks of anesthesia including but not limited to:  ?- adverse reactions to medications ?- damage to eyes, teeth, lips or other oral mucosa ?- nerve damage due to positioning  ?-  sore throat or hoarseness ?- damage to heart, brain, nerves, lungs, other parts of body or loss of life ? ?Informed patient about role of CRNA in peri- and intra-operative care.  Patient voiced understanding.)  ? ? ? ? ? ?Anesthesia Quick Evaluation ? ?

## 2021-04-20 NOTE — Op Note (Signed)
--  OP NOTE ? ?DATE OF PROCEDURE: 04/20/2021  ? ?SURGEON: Lysle Pearl ? ?ANESTHESIA: LMA ? ?PRE-OPERATIVE DIAGNOSIS: anal cancer requring port for chemotherapy  ? ?POST-OPERATIVE DIAGNOSIS: same ? ?PROCEDURE(S):  ?1.) Percutaneous access of Right IJ vein under ultrasound guidance  ? ?2.) Insertion of tunneled RIJ central venous catheter with subcutaneous port ? ?INTRAOPERATIVE FINDINGS: Patent easily compressible RIJ vein with appropriate respiratory variations and well-secured tunneled central venous catheter with subcutaneous port at completion of the procedure, heplocked after confirming ease of draw and push ? ?ESTIMATED BLOOD LOSS: Minimal (<20 mL)  ? ?SPECIMENS: None  ? ?IMPLANTS: 29F tunneled Bard PowerPort central venous catheter with subcutaneous port ? ?DRAINS: None  ? ?COMPLICATIONS: None apparent  ? ?CONDITION AT COMPLETION: Hemodynamically stable, awake  ? ?DISPOSITION: PACU  ? ?INDICATION(S) FOR PROCEDURE:  ?Patient is a 66 y.o. male who presented with above diagnosis.  All risks, benefits, and alternatives to above elective procedures were discussed with the patient, who elected to proceed, and informed consent was accordingly obtained at that time. ? ?DETAILS OF PROCEDURE:  ?Patient was brought to the operative suite and appropriately identified. In Trendelenburg position, RIGHT IJ venous access site was prepped and draped in the usual sterile fashion, and following a timeout, percutaneous venous access was obtained under ultrasound guidance using Seldinger technique, by which local anesthetic was injected over the area, and access needle was inserted under direct ultrasound visualization through which soft guidewire was advanced with no resistance, over which access needle was withdrawn. Guidewire was secured, attention was directed to injection of local anesthetic along the planned tunnel site, 2-3 cm transverse RIGHT chest incision was made and confirmed to accommodate the subcutaneous port, and flushed  catheter was tunneled retrograde from the port site over to the vein access site. Insertion sheath was advanced over the guidewire, which was withdrawn along with the insertion sheath dilator with no resistance. The catheter was introduced through the sheath and tip left in the [place] junction under fluoro guidance and catheter cut to appropriate length.  Catheter connected to port and placed within planned fixation site.  Fluro confirmed no kink within the entire length of the catheter at this point.  Port fixed to the pocket on two side to avoid twisting. Port was confirmed to withdraw blood and flush easily with included Heuber needle, and port heplocked. Dermis at the subcutaneous pocket was re-approximated using buried interrupted 3-0 Vicryl suture, and 4-0 Monocryl suture was used to re-approximate skin at the insertion and subcutaneous port sites in running subcuticular fashion.  Port Incision then dressed with steristrips, 2x2, and tegaderm. IJ insertion incision covered with dermabond. Patient was then awakened from anesthesia and transferred to PACU in stable condition.  [place] images saved in paper chart and fluoro images saved in Epic. ? ?CXR post op confirmed proper placement of port and no evidence of pneumothorax. ? ?

## 2021-04-20 NOTE — Anesthesia Procedure Notes (Signed)
Procedure Name: LMA Insertion ?Date/Time: 04/20/2021 10:34 AM ?Performed by: Aline Brochure, CRNA ?Pre-anesthesia Checklist: Patient identified, Patient being monitored, Timeout performed, Emergency Drugs available and Suction available ?Patient Re-evaluated:Patient Re-evaluated prior to induction ?Oxygen Delivery Method: Circle system utilized ?Preoxygenation: Pre-oxygenation with 100% oxygen ?Induction Type: IV induction ?Ventilation: Mask ventilation without difficulty ?LMA: LMA inserted ?LMA Size: 4.5 ?Tube type: Oral ?Number of attempts: 1 ?Placement Confirmation: positive ETCO2 and breath sounds checked- equal and bilateral ?Tube secured with: Tape ?Dental Injury: Teeth and Oropharynx as per pre-operative assessment  ? ? ? ? ?

## 2021-04-20 NOTE — Interval H&P Note (Signed)
No change. OK to proceed.

## 2021-04-20 NOTE — Anesthesia Postprocedure Evaluation (Signed)
Anesthesia Post Note ? ?Patient: GARVIN ELLENA ? ?Procedure(s) Performed: INSERTION PORT-A-CATH (Right: Chest) ? ?Patient location during evaluation: PACU ?Anesthesia Type: General ?Level of consciousness: awake and alert, oriented and patient cooperative ?Pain management: pain level controlled ?Vital Signs Assessment: post-procedure vital signs reviewed and stable ?Respiratory status: spontaneous breathing, nonlabored ventilation and respiratory function stable ?Cardiovascular status: blood pressure returned to baseline and stable ?Postop Assessment: adequate PO intake ?Anesthetic complications: no ? ? ?No notable events documented. ? ? ?Last Vitals:  ?Vitals:  ? 04/20/21 1215 04/20/21 1232  ?BP: (!) 156/90 (!) 142/105  ?Pulse: 78 77  ?Resp: (!) 21 17  ?Temp:  36.6 ?C  ?SpO2: 99% 99%  ?  ?Last Pain:  ?Vitals:  ? 04/20/21 1232  ?TempSrc: Temporal  ?PainSc: 2   ? ? ?  ?  ?  ?  ?  ?  ? ?Darrin Nipper ? ? ? ? ?

## 2021-04-20 NOTE — Transfer of Care (Signed)
Immediate Anesthesia Transfer of Care Note ? ?Patient: Joseph Mcgee ? ?Procedure(s) Performed: INSERTION PORT-A-CATH (Right: Chest) ? ?Patient Location: PACU ? ?Anesthesia Type:General ? ?Level of Consciousness: drowsy ? ?Airway & Oxygen Therapy: Patient Spontanous Breathing and Patient connected to face mask oxygen ? ?Post-op Assessment: Report given to RN and Post -op Vital signs reviewed and stable ? ?Post vital signs: Reviewed and stable ? ?Last Vitals:  ?Vitals Value Taken Time  ?BP 123/75   ?Temp    ?Pulse 92 04/20/21 1146  ?Resp 13   ?SpO2 99 % 04/20/21 1146  ?Vitals shown include unvalidated device data. ? ?Last Pain:  ?Vitals:  ? 04/20/21 0959  ?TempSrc: Temporal  ?PainSc: 0-No pain  ?   ? ?  ? ?Complications: No notable events documented. ?

## 2021-04-20 NOTE — Discharge Instructions (Addendum)
Port cath placement, Care After ?This sheet gives you information about how to care for yourself after your procedure. Your health care provider may also give you more specific instructions. If you have problems or questions, contact your health care provider. ?What can I expect after the procedure? ?After the procedure, it is common to have: ?Soreness. ?Bruising. ?Itching. ?Follow these instructions at home: ?site care ?Follow instructions from your health care provider about how to take care of your site. Make sure you: ?Wash your hands with soap and water before and after you change your bandage (dressing). If soap and water are not available, use hand sanitizer. ?Leave stitches (sutures), skin glue, or adhesive strips in place. These skin closures may need to stay in place for 2 weeks or longer. If adhesive strip edges start to loosen and curl up, you may trim the loose edges. Do not remove adhesive strips completely unless your health care provider tells you to do that. ?If the area bleeds or bruises, apply gentle pressure for 10 minutes. ?KEEP OUTER GAUZE AND TEGADERM DRESSING INTACT FOR 48HRS, THE OK TO REMOVE AND SHOWER. STERISTRIPS WILL FALL OFF ON THEIR OWN ? ?Check your site every day for signs of infection. Check for: ?Redness, swelling, or pain. ?Fluid or blood. ?Warmth. ?Pus or a bad smell. ? ?General instructions ?Rest and then return to your normal activities as told by your health care provider. ? tylenol and advil as needed for discomfort.  Please alternate between the two every four hours as needed for pain.   ? Use narcotics, if prescribed, only when tylenol and motrin is not enough to control pain. ? 325-650mg  every 8hrs to max of 3000mg /24hrs (including the 325mg  in every norco dose) for the tylenol.   ? Advil up to 800mg  per dose every 8hrs as needed for pain.   ?Keep all follow-up visits as told by your health care provider. This is important. ?Contact a health care provider if: ?You have  redness, swelling, or pain around your site. ?You have fluid or blood coming from your site. ?Your site feels warm to the touch. ?You have pus or a bad smell coming from your site. ?You have a fever. ?Your sutures, skin glue, or adhesive strips loosen or come off sooner than expected. ?Get help right away if: ?You have bleeding that does not stop with pressure or a dressing. ?Summary ?After the procedure, it is common to have some soreness, bruising, and itching at the site. ?Follow instructions from your health care provider about how to take care of your site. ?Check your site every day for signs of infection. ?Contact a health care provider if you have redness, swelling, or pain around your site, or your site feels warm to the touch. ?Keep all follow-up visits as told by your health care provider. This is important. ?This information is not intended to replace advice given to you by your health care provider. Make sure you discuss any questions you have with your health care provider. ?Document Released: 03/05/2015 Document Revised: 08/06/2017 Document Reviewed: 08/06/2017 ?Elsevier Interactive Patient Education ? 2019 [employer]. ? ?AMBULATORY SURGERY  ?DISCHARGE INSTRUCTIONS ? ? ?The drugs that you were given will stay in your system until tomorrow so for the next 24 hours you should not: ? ?Drive an automobile ?Make any legal decisions ?Drink any alcoholic beverage ? ? ?You may resume regular meals tomorrow.  Today it is better to start with liquids and gradually work up to solid foods. ? ?You  may eat anything you prefer, but it is better to start with liquids, then soup and crackers, and gradually work up to solid foods. ? ? ?Please notify your doctor immediately if you have any unusual bleeding, trouble breathing, redness and pain at the surgery site, drainage, fever, or pain not relieved by medication. ? ? ? ?Additional Instructions: ? ?AMBULATORY SURGERY  ?DISCHARGE INSTRUCTIONS ? ? ?The drugs that you  were given will stay in your system until tomorrow so for the next 24 hours you should not: ? ?Drive an automobile ?Make any legal decisions ?Drink any alcoholic beverage ? ? ?You may resume regular meals tomorrow.  Today it is better to start with liquids and gradually work up to solid foods. ? ?You may eat anything you prefer, but it is better to start with liquids, then soup and crackers, and gradually work up to solid foods. ? ? ?Please notify your doctor immediately if you have any unusual bleeding, trouble breathing, redness and pain at the surgery site, drainage, fever, or pain not relieved by medication. ? ? ? ?Additional Instructions: ? ? ?Please contact your physician with any problems or Same Day Surgery at [phone], Monday through Friday 6 am to 4 pm, or [place] at [facility] number at [phone].  ?Please contact your physician with any problems or Same Day Surgery at [phone], Monday through Friday 6 am to 4 pm, or [place] at [facility] number at [phone].  ? ? ?

## 2021-04-21 ENCOUNTER — Encounter: Payer: Self-pay | Admitting: Surgery

## 2021-04-25 ENCOUNTER — Inpatient Hospital Stay: Payer: Medicare Other

## 2021-04-25 ENCOUNTER — Other Ambulatory Visit: Payer: Self-pay

## 2021-04-25 ENCOUNTER — Inpatient Hospital Stay: Payer: Medicare Other | Admitting: Oncology

## 2021-04-25 ENCOUNTER — Encounter: Payer: Self-pay | Admitting: Oncology

## 2021-04-25 VITALS — BP 147/88 | HR 87 | Temp 97.4°F | Wt 196.0 lb

## 2021-04-25 DIAGNOSIS — Z95828 Presence of other vascular implants and grafts: Secondary | ICD-10-CM | POA: Diagnosis not present

## 2021-04-25 DIAGNOSIS — K1379 Other lesions of oral mucosa: Secondary | ICD-10-CM | POA: Insufficient documentation

## 2021-04-25 DIAGNOSIS — Z5111 Encounter for antineoplastic chemotherapy: Secondary | ICD-10-CM | POA: Insufficient documentation

## 2021-04-25 DIAGNOSIS — C210 Malignant neoplasm of anus, unspecified: Secondary | ICD-10-CM | POA: Insufficient documentation

## 2021-04-25 DIAGNOSIS — E860 Dehydration: Secondary | ICD-10-CM | POA: Insufficient documentation

## 2021-04-25 DIAGNOSIS — C775 Secondary and unspecified malignant neoplasm of intrapelvic lymph nodes: Secondary | ICD-10-CM | POA: Insufficient documentation

## 2021-04-25 DIAGNOSIS — T451X5A Adverse effect of antineoplastic and immunosuppressive drugs, initial encounter: Secondary | ICD-10-CM | POA: Insufficient documentation

## 2021-04-25 DIAGNOSIS — Z87891 Personal history of nicotine dependence: Secondary | ICD-10-CM | POA: Diagnosis not present

## 2021-04-25 DIAGNOSIS — N179 Acute kidney failure, unspecified: Secondary | ICD-10-CM | POA: Insufficient documentation

## 2021-04-25 LAB — COMPREHENSIVE METABOLIC PANEL
ALT: 24 U/L (ref 0–44)
AST: 22 U/L (ref 15–41)
Albumin: 3.8 g/dL (ref 3.5–5.0)
Alkaline Phosphatase: 87 U/L (ref 38–126)
Anion gap: 12 (ref 5–15)
BUN: 33 mg/dL — ABNORMAL HIGH (ref 8–23)
CO2: 23 mmol/L (ref 22–32)
Calcium: 9.1 mg/dL (ref 8.9–10.3)
Chloride: 101 mmol/L (ref 98–111)
Creatinine, Ser: 1.59 mg/dL — ABNORMAL HIGH (ref 0.61–1.24)
GFR, Estimated: 48 mL/min — ABNORMAL LOW (ref >60)
Glucose, Bld: 214 mg/dL — ABNORMAL HIGH (ref 70–99)
Potassium: 3.9 mmol/L (ref 3.5–5.1)
Sodium: 136 mmol/L (ref 135–145)
Total Bilirubin: 0.3 mg/dL (ref 0.3–1.2)
Total Protein: 8 g/dL (ref 6.5–8.1)

## 2021-04-25 LAB — CBC WITH DIFFERENTIAL/PLATELET
Abs Immature Granulocytes: 0.04 10*3/uL (ref 0.00–0.07)
Basophils Absolute: 0.1 10*3/uL (ref 0.0–0.1)
Basophils Relative: 1 %
Eosinophils Absolute: 0.3 10*3/uL (ref 0.0–0.5)
Eosinophils Relative: 3 %
HCT: 48.2 % (ref 39.0–52.0)
Hemoglobin: 16.3 g/dL (ref 13.0–17.0)
Immature Granulocytes: 0 %
Lymphocytes Relative: 24 %
Lymphs Abs: 2.3 10*3/uL (ref 0.7–4.0)
MCH: 29.6 pg (ref 26.0–34.0)
MCHC: 33.8 g/dL (ref 30.0–36.0)
MCV: 87.6 fL (ref 80.0–100.0)
Monocytes Absolute: 0.8 10*3/uL (ref 0.1–1.0)
Monocytes Relative: 9 %
Neutro Abs: 6.1 10*3/uL (ref 1.7–7.7)
Neutrophils Relative %: 63 %
Platelets: 336 10*3/uL (ref 150–400)
RBC: 5.5 MIL/uL (ref 4.22–5.81)
RDW: 12.3 % (ref 11.5–15.5)
WBC: 9.7 10*3/uL (ref 4.0–10.5)
nRBC: 0 % (ref 0.0–0.2)

## 2021-04-25 MED ORDER — SODIUM CHLORIDE 0.9 % IV SOLN
1000.0000 mg/m2/d | INTRAVENOUS | Status: DC
  Administered 2021-04-25: 8500 mg via INTRAVENOUS
  Filled 2021-04-25: qty 170

## 2021-04-25 MED ORDER — SODIUM CHLORIDE 0.9 % IV SOLN
Freq: Once | INTRAVENOUS | Status: AC
  Filled 2021-04-25: qty 250

## 2021-04-25 MED ORDER — PROCHLORPERAZINE MALEATE 10 MG PO TABS
10.0000 mg | ORAL_TABLET | Freq: Once | ORAL | Status: AC
  Administered 2021-04-25: 10 mg via ORAL
  Filled 2021-04-25: qty 1

## 2021-04-25 MED ORDER — SODIUM CHLORIDE 0.9 % IV SOLN
Freq: Once | INTRAVENOUS | Status: DC
  Filled 2021-04-25: qty 250

## 2021-04-25 MED ORDER — MITOMYCIN CHEMO IV INJECTION 20 MG
9.4000 mg/m2 | Freq: Once | INTRAVENOUS | Status: AC
  Administered 2021-04-25: 20 mg via INTRAVENOUS
  Filled 2021-04-25: qty 40
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
## 2021-04-25 NOTE — Patient Instructions (Signed)
[facility]  Discharge Instructions: ?Thank you for choosing [facility] to provide your oncology and hematology care.  ?If you have a lab appointment with the Cancer Center, please go directly to the Cancer Center and check in at the registration area. ? ?Wear comfortable clothing and clothing appropriate for easy access to any Portacath or PICC line.  ? ?We strive to give you quality time with your provider. You may need to reschedule your appointment if you arrive late (15 or more minutes).  Arriving late affects you and other patients whose appointments are after yours.  Also, if you miss three or more appointments without notifying the office, you may be dismissed from the clinic at the provider?s discretion.    ?  ?For prescription refill requests, have your pharmacy contact our office and allow 72 hours for refills to be completed.   ? ?  ?To help prevent nausea and vomiting after your treatment, we encourage you to take your nausea medication as directed. ? ?BELOW ARE SYMPTOMS THAT SHOULD BE REPORTED IMMEDIATELY: ?*FEVER GREATER THAN 100.4 F (38 ?C) OR HIGHER ?*CHILLS OR SWEATING ?*NAUSEA AND VOMITING THAT IS NOT CONTROLLED WITH YOUR NAUSEA MEDICATION ?*UNUSUAL SHORTNESS OF BREATH ?*UNUSUAL BRUISING OR BLEEDING ?*URINARY PROBLEMS (pain or burning when urinating, or frequent urination) ?*BOWEL PROBLEMS (unusual diarrhea, constipation, pain near the anus) ?TENDERNESS IN MOUTH AND THROAT WITH OR WITHOUT PRESENCE OF ULCERS (sore throat, sores in mouth, or a toothache) ?UNUSUAL RASH, SWELLING OR PAIN  ?UNUSUAL VAGINAL DISCHARGE OR ITCHING  ? ?Items with * indicate a potential emergency and should be followed up as soon as possible or go to the Emergency Department if any problems should occur. ? ?Please show the CHEMOTHERAPY ALERT CARD or IMMUNOTHERAPY ALERT CARD at check-in to the Emergency Department and triage nurse. ? ?Should you have questions after your visit  or need to cancel or reschedule your appointment, please contact [facility]  [phone] and follow the prompts.  Office hours are 8:00 a.m. to 4:30 p.m. Monday - Friday. Please note that voicemails left after 4:00 p.m. may not be returned until the following business day.  We are closed weekends and major holidays. You have access to a nurse at all times for urgent questions. Please call the main number to the clinic [phone] and follow the prompts. ? ?For any non-urgent questions, you may also contact your provider using MyChart. We now offer e-Visits for anyone 18 and older to request care online for non-urgent symptoms. For details visit [email or web address]. ?  ?Also download the MyChart app! Go to the app store, search "MyChart", open the app, select [facility], and log in with your MyChart username and password. ? ?Due to Covid, a mask is required upon entering the hospital/clinic. If you do not have a mask, one will be given to you upon arrival. For doctor visits, patients may have 1 support person aged 18 or older with them. For treatment visits, patients cannot have anyone with them due to current Covid guidelines and our immunocompromised population.  ?

## 2021-04-25 NOTE — Progress Notes (Signed)
MD has reviewed all labs. Creatinine 1.59. Ok to proceed with treatment today per Dr. Tasia Catchings. Patient will also receive 1 liter of NS today. ?

## 2021-04-26 ENCOUNTER — Ambulatory Visit: Admission: RE | Admit: 2021-04-26 | Payer: Medicare Other | Source: Ambulatory Visit

## 2021-04-26 DIAGNOSIS — C775 Secondary and unspecified malignant neoplasm of intrapelvic lymph nodes: Secondary | ICD-10-CM | POA: Diagnosis not present

## 2021-04-27 ENCOUNTER — Inpatient Hospital Stay: Payer: Medicare Other

## 2021-04-27 ENCOUNTER — Ambulatory Visit
Admission: RE | Admit: 2021-04-27 | Discharge: 2021-04-27 | Disposition: A | Discharge status: Home / Self Care | Payer: Medicare Other | Source: Ambulatory Visit | Attending: Radiation Oncology | Admitting: Radiation Oncology

## 2021-04-27 DIAGNOSIS — C775 Secondary and unspecified malignant neoplasm of intrapelvic lymph nodes: Secondary | ICD-10-CM | POA: Diagnosis not present

## 2021-04-28 ENCOUNTER — Ambulatory Visit
Admission: RE | Admit: 2021-04-28 | Discharge: 2021-04-28 | Disposition: A | Discharge status: Home / Self Care | Payer: Medicare Other | Source: Ambulatory Visit | Attending: Radiation Oncology | Admitting: Radiation Oncology

## 2021-04-28 DIAGNOSIS — C775 Secondary and unspecified malignant neoplasm of intrapelvic lymph nodes: Secondary | ICD-10-CM | POA: Diagnosis not present

## 2021-04-29 ENCOUNTER — Inpatient Hospital Stay: Payer: Medicare Other

## 2021-04-29 ENCOUNTER — Other Ambulatory Visit: Payer: Self-pay

## 2021-04-29 ENCOUNTER — Ambulatory Visit
Admission: RE | Admit: 2021-04-29 | Discharge: 2021-04-29 | Disposition: A | Discharge status: Home / Self Care | Payer: Medicare Other | Source: Ambulatory Visit | Attending: Radiation Oncology | Admitting: Radiation Oncology

## 2021-04-29 VITALS — BP 150/79 | HR 84 | Resp 18

## 2021-04-29 DIAGNOSIS — C775 Secondary and unspecified malignant neoplasm of intrapelvic lymph nodes: Secondary | ICD-10-CM | POA: Diagnosis not present

## 2021-04-29 DIAGNOSIS — C210 Malignant neoplasm of anus, unspecified: Secondary | ICD-10-CM

## 2021-04-29 MED ORDER — HEPARIN SOD (PORK) LOCK FLUSH 100 UNIT/ML IV SOLN
500.0000 [IU] | Freq: Once | INTRAVENOUS | Status: AC | PRN
  Administered 2021-04-29: 500 [IU]
  Filled 2021-04-29: qty 5

## 2021-04-29 MED ORDER — SODIUM CHLORIDE 0.9% FLUSH
10.0000 mL | INTRAVENOUS | Status: DC | PRN
  Administered 2021-04-29: 10 mL
  Filled 2021-04-29: qty 10

## 2021-05-02 ENCOUNTER — Inpatient Hospital Stay (HOSPITAL_BASED_OUTPATIENT_CLINIC_OR_DEPARTMENT_OTHER): Payer: Medicare Other | Admitting: Oncology

## 2021-05-02 ENCOUNTER — Encounter: Payer: Self-pay | Admitting: Oncology

## 2021-05-02 ENCOUNTER — Inpatient Hospital Stay: Payer: Medicare Other

## 2021-05-02 ENCOUNTER — Ambulatory Visit
Admission: RE | Admit: 2021-05-02 | Discharge: 2021-05-02 | Disposition: A | Discharge status: Home / Self Care | Payer: Medicare Other | Source: Ambulatory Visit | Attending: Radiation Oncology | Admitting: Radiation Oncology

## 2021-05-02 ENCOUNTER — Other Ambulatory Visit: Payer: Self-pay

## 2021-05-02 DIAGNOSIS — C210 Malignant neoplasm of anus, unspecified: Secondary | ICD-10-CM | POA: Diagnosis not present

## 2021-05-02 DIAGNOSIS — E860 Dehydration: Secondary | ICD-10-CM

## 2021-05-02 DIAGNOSIS — C775 Secondary and unspecified malignant neoplasm of intrapelvic lymph nodes: Secondary | ICD-10-CM | POA: Diagnosis not present

## 2021-05-02 LAB — COMPREHENSIVE METABOLIC PANEL
ALT: 21 U/L (ref 0–44)
AST: 20 U/L (ref 15–41)
Albumin: 4.1 g/dL (ref 3.5–5.0)
Alkaline Phosphatase: 85 U/L (ref 38–126)
Anion gap: 10 (ref 5–15)
BUN: 33 mg/dL — ABNORMAL HIGH (ref 8–23)
CO2: 23 mmol/L (ref 22–32)
Calcium: 9 mg/dL (ref 8.9–10.3)
Chloride: 98 mmol/L (ref 98–111)
Creatinine, Ser: 1.46 mg/dL — ABNORMAL HIGH (ref 0.61–1.24)
GFR, Estimated: 53 mL/min — ABNORMAL LOW (ref >60)
Glucose, Bld: 304 mg/dL — ABNORMAL HIGH (ref 70–99)
Potassium: 3.7 mmol/L (ref 3.5–5.1)
Sodium: 131 mmol/L — ABNORMAL LOW (ref 135–145)
Total Bilirubin: 0.9 mg/dL (ref 0.3–1.2)
Total Protein: 7.9 g/dL (ref 6.5–8.1)

## 2021-05-02 LAB — CBC WITH DIFFERENTIAL/PLATELET
Abs Immature Granulocytes: 0.05 10*3/uL (ref 0.00–0.07)
Basophils Absolute: 0.1 10*3/uL (ref 0.0–0.1)
Basophils Relative: 1 %
Eosinophils Absolute: 0.1 10*3/uL (ref 0.0–0.5)
Eosinophils Relative: 1 %
HCT: 47.9 % (ref 39.0–52.0)
Hemoglobin: 16.3 g/dL (ref 13.0–17.0)
Immature Granulocytes: 1 %
Lymphocytes Relative: 17 %
Lymphs Abs: 1.5 10*3/uL (ref 0.7–4.0)
MCH: 29.4 pg (ref 26.0–34.0)
MCHC: 34 g/dL (ref 30.0–36.0)
MCV: 86.5 fL (ref 80.0–100.0)
Monocytes Absolute: 0.3 10*3/uL (ref 0.1–1.0)
Monocytes Relative: 3 %
Neutro Abs: 7 10*3/uL (ref 1.7–7.7)
Neutrophils Relative %: 77 %
Platelets: 270 10*3/uL (ref 150–400)
RBC: 5.54 MIL/uL (ref 4.22–5.81)
RDW: 12.2 % (ref 11.5–15.5)
WBC: 9 10*3/uL (ref 4.0–10.5)
nRBC: 0 % (ref 0.0–0.2)

## 2021-05-02 MED ORDER — SODIUM CHLORIDE 0.9% FLUSH
10.0000 mL | Freq: Once | INTRAVENOUS | Status: AC
  Administered 2021-05-02: 10 mL via INTRAVENOUS
  Filled 2021-05-02: qty 10

## 2021-05-02 MED ORDER — HEPARIN SOD (PORK) LOCK FLUSH 100 UNIT/ML IV SOLN
500.0000 [IU] | Freq: Once | INTRAVENOUS | Status: AC
  Administered 2021-05-02: 500 [IU] via INTRAVENOUS
  Filled 2021-05-02: qty 5

## 2021-05-02 MED ORDER — MAGIC MOUTHWASH W/LIDOCAINE: 5.0000 mL | Freq: Four times a day (QID) | ORAL | 3 refills | Status: DC

## 2021-05-02 MED ORDER — SODIUM CHLORIDE 0.9 % IV SOLN
Freq: Once | INTRAVENOUS | Status: AC
  Filled 2021-05-02: qty 250

## 2021-05-02 MED ORDER — LIDOCAINE VISCOUS HCL 2 % MT SOLN: 15.0000 mL | OROMUCOSAL | 0 refills | Status: DC | PRN

## 2021-05-02 NOTE — Progress Notes (Unsigned)
Patient here for follow up. Reports he has been having mouth sores that are painful. Also reports nausea and vomitting, alleviated by antiemetics ?

## 2021-05-02 NOTE — Progress Notes (Signed)
Developed mucositis over the weekend. Receiving 1 L NS. Pt is drinking ensure, pudding and yogurt.Pt denies any nausea today. ?
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
## 2021-05-03 ENCOUNTER — Ambulatory Visit
Admission: RE | Admit: 2021-05-03 | Discharge: 2021-05-03 | Disposition: A | Discharge status: Home / Self Care | Payer: Medicare Other | Source: Ambulatory Visit | Attending: Radiation Oncology | Admitting: Radiation Oncology

## 2021-05-03 DIAGNOSIS — C775 Secondary and unspecified malignant neoplasm of intrapelvic lymph nodes: Secondary | ICD-10-CM | POA: Diagnosis not present

## 2021-05-04 ENCOUNTER — Ambulatory Visit
Admission: RE | Admit: 2021-05-04 | Discharge: 2021-05-04 | Disposition: A | Discharge status: Home / Self Care | Payer: Medicare Other | Source: Ambulatory Visit | Attending: Radiation Oncology | Admitting: Radiation Oncology

## 2021-05-04 DIAGNOSIS — C775 Secondary and unspecified malignant neoplasm of intrapelvic lymph nodes: Secondary | ICD-10-CM | POA: Diagnosis not present

## 2021-05-05 ENCOUNTER — Ambulatory Visit
Admission: RE | Admit: 2021-05-05 | Discharge: 2021-05-05 | Disposition: A | Discharge status: Home / Self Care | Payer: Medicare Other | Source: Ambulatory Visit | Attending: Radiation Oncology | Admitting: Radiation Oncology

## 2021-05-05 DIAGNOSIS — C775 Secondary and unspecified malignant neoplasm of intrapelvic lymph nodes: Secondary | ICD-10-CM | POA: Diagnosis not present

## 2021-05-06 ENCOUNTER — Ambulatory Visit
Admission: RE | Admit: 2021-05-06 | Discharge: 2021-05-06 | Disposition: A | Discharge status: Home / Self Care | Payer: Medicare Other | Source: Ambulatory Visit | Attending: Radiation Oncology | Admitting: Radiation Oncology

## 2021-05-06 DIAGNOSIS — C775 Secondary and unspecified malignant neoplasm of intrapelvic lymph nodes: Secondary | ICD-10-CM | POA: Diagnosis not present

## 2021-05-09 ENCOUNTER — Ambulatory Visit
Admission: RE | Admit: 2021-05-09 | Discharge: 2021-05-09 | Disposition: A | Discharge status: Home / Self Care | Payer: Medicare Other | Source: Ambulatory Visit | Attending: Radiation Oncology | Admitting: Radiation Oncology

## 2021-05-09 DIAGNOSIS — C775 Secondary and unspecified malignant neoplasm of intrapelvic lymph nodes: Secondary | ICD-10-CM | POA: Diagnosis not present

## 2021-05-10 ENCOUNTER — Ambulatory Visit
Admission: RE | Admit: 2021-05-10 | Discharge: 2021-05-10 | Disposition: A | Discharge status: Home / Self Care | Payer: Medicare Other | Source: Ambulatory Visit | Attending: Radiation Oncology | Admitting: Radiation Oncology

## 2021-05-10 DIAGNOSIS — C775 Secondary and unspecified malignant neoplasm of intrapelvic lymph nodes: Secondary | ICD-10-CM | POA: Diagnosis not present

## 2021-05-11 ENCOUNTER — Ambulatory Visit
Admission: RE | Admit: 2021-05-11 | Discharge: 2021-05-11 | Disposition: A | Discharge status: Home / Self Care | Payer: Medicare Other | Source: Ambulatory Visit | Attending: Radiation Oncology | Admitting: Radiation Oncology

## 2021-05-11 ENCOUNTER — Ambulatory Visit: Payer: Medicare Other | Admitting: Urology

## 2021-05-11 ENCOUNTER — Ambulatory Visit
Admission: RE | Admit: 2021-05-11 | Discharge: 2021-05-11 | Disposition: A | Discharge status: Home / Self Care | Payer: Medicare Other | Attending: Urology | Admitting: Urology

## 2021-05-11 ENCOUNTER — Ambulatory Visit
Admission: RE | Admit: 2021-05-11 | Discharge: 2021-05-11 | Disposition: A | Discharge status: Home / Self Care | Payer: Medicare Other | Source: Ambulatory Visit | Attending: Urology | Admitting: Urology

## 2021-05-11 ENCOUNTER — Other Ambulatory Visit: Payer: Self-pay

## 2021-05-11 VITALS — BP 131/86 | HR 82 | Ht 69.0 in | Wt 196.0 lb

## 2021-05-11 DIAGNOSIS — N200 Calculus of kidney: Secondary | ICD-10-CM

## 2021-05-11 DIAGNOSIS — C775 Secondary and unspecified malignant neoplasm of intrapelvic lymph nodes: Secondary | ICD-10-CM | POA: Diagnosis not present

## 2021-05-11 DIAGNOSIS — R300 Dysuria: Secondary | ICD-10-CM
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
## 2021-05-12 ENCOUNTER — Ambulatory Visit
Admission: RE | Admit: 2021-05-12 | Discharge: 2021-05-12 | Disposition: A | Discharge status: Home / Self Care | Payer: Medicare Other | Source: Ambulatory Visit | Attending: Radiation Oncology | Admitting: Radiation Oncology

## 2021-05-12 DIAGNOSIS — C775 Secondary and unspecified malignant neoplasm of intrapelvic lymph nodes: Secondary | ICD-10-CM | POA: Diagnosis not present

## 2021-05-12 LAB — URINALYSIS, COMPLETE
Bilirubin, UA: NEGATIVE
Ketones, UA: NEGATIVE
Leukocytes,UA: NEGATIVE
Nitrite, UA: NEGATIVE
Protein,UA: NEGATIVE
RBC, UA: NEGATIVE
Specific Gravity, UA: 1.015 (ref 1.005–1.030)
Urobilinogen, Ur: 0.2 mg/dL (ref 0.2–1.0)
pH, UA: 5.5 (ref 5.0–7.5)

## 2021-05-12 LAB — MICROSCOPIC EXAMINATION: Bacteria, UA: NONE SEEN

## 2021-05-13 ENCOUNTER — Ambulatory Visit
Admission: RE | Admit: 2021-05-13 | Discharge: 2021-05-13 | Disposition: A | Discharge status: Home / Self Care | Payer: Medicare Other | Source: Ambulatory Visit | Attending: Radiation Oncology | Admitting: Radiation Oncology

## 2021-05-13 DIAGNOSIS — C775 Secondary and unspecified malignant neoplasm of intrapelvic lymph nodes: Secondary | ICD-10-CM | POA: Diagnosis not present

## 2021-05-16 ENCOUNTER — Ambulatory Visit: Admission: RE | Admit: 2021-05-16 | Payer: Medicare Other | Source: Ambulatory Visit

## 2021-05-16 ENCOUNTER — Ambulatory Visit: Payer: Medicare Other

## 2021-05-16 LAB — CULTURE, URINE COMPREHENSIVE

## 2021-05-17 ENCOUNTER — Ambulatory Visit: Payer: Medicare Other

## 2021-05-17 LAB — CALCULI, WITH PHOTOGRAPH (CLINICAL LAB)
Calcium Oxalate Monohydrate: 100 %
Weight Calculi: 196 mg

## 2021-05-18 ENCOUNTER — Ambulatory Visit: Payer: Medicare Other

## 2021-05-19 ENCOUNTER — Ambulatory Visit
Admission: RE | Admit: 2021-05-19 | Discharge: 2021-05-19 | Disposition: A | Discharge status: Home / Self Care | Payer: Medicare Other | Source: Ambulatory Visit | Attending: Radiation Oncology | Admitting: Radiation Oncology

## 2021-05-19 DIAGNOSIS — C775 Secondary and unspecified malignant neoplasm of intrapelvic lymph nodes: Secondary | ICD-10-CM | POA: Diagnosis not present

## 2021-05-20 ENCOUNTER — Ambulatory Visit
Admission: RE | Admit: 2021-05-20 | Discharge: 2021-05-20 | Disposition: A | Discharge status: Home / Self Care | Payer: Medicare Other | Source: Ambulatory Visit | Attending: Radiation Oncology | Admitting: Radiation Oncology

## 2021-05-20 DIAGNOSIS — C775 Secondary and unspecified malignant neoplasm of intrapelvic lymph nodes: Secondary | ICD-10-CM | POA: Diagnosis not present

## 2021-05-22 ENCOUNTER — Other Ambulatory Visit: Payer: Self-pay | Admitting: Oncology

## 2021-05-23 ENCOUNTER — Ambulatory Visit
Admission: RE | Admit: 2021-05-23 | Discharge: 2021-05-23 | Disposition: A | Discharge status: Home / Self Care | Payer: Medicare Other | Source: Ambulatory Visit | Attending: Radiation Oncology | Admitting: Radiation Oncology

## 2021-05-23 ENCOUNTER — Inpatient Hospital Stay: Payer: Medicare Other

## 2021-05-23 ENCOUNTER — Inpatient Hospital Stay: Payer: Medicare Other | Admitting: Oncology

## 2021-05-23 ENCOUNTER — Encounter: Payer: Self-pay | Admitting: Oncology

## 2021-05-23 VITALS — BP 149/92 | HR 94 | Temp 96.2°F | Resp 19 | Wt 196.6 lb

## 2021-05-23 DIAGNOSIS — G893 Neoplasm related pain (acute) (chronic): Secondary | ICD-10-CM | POA: Insufficient documentation

## 2021-05-23 DIAGNOSIS — K521 Toxic gastroenteritis and colitis: Secondary | ICD-10-CM | POA: Diagnosis not present

## 2021-05-23 DIAGNOSIS — C210 Malignant neoplasm of anus, unspecified: Secondary | ICD-10-CM | POA: Insufficient documentation

## 2021-05-23 DIAGNOSIS — Z79899 Other long term (current) drug therapy: Secondary | ICD-10-CM | POA: Insufficient documentation

## 2021-05-23 DIAGNOSIS — Z5111 Encounter for antineoplastic chemotherapy: Secondary | ICD-10-CM | POA: Insufficient documentation

## 2021-05-23 DIAGNOSIS — C775 Secondary and unspecified malignant neoplasm of intrapelvic lymph nodes: Secondary | ICD-10-CM | POA: Diagnosis not present

## 2021-05-23 DIAGNOSIS — Z95828 Presence of other vascular implants and grafts: Secondary | ICD-10-CM | POA: Diagnosis not present

## 2021-05-23 DIAGNOSIS — T451X5A Adverse effect of antineoplastic and immunosuppressive drugs, initial encounter: Secondary | ICD-10-CM | POA: Diagnosis not present

## 2021-05-23 DIAGNOSIS — E876 Hypokalemia: Secondary | ICD-10-CM | POA: Insufficient documentation

## 2021-05-23 DIAGNOSIS — E871 Hypo-osmolality and hyponatremia: Secondary | ICD-10-CM | POA: Insufficient documentation

## 2021-05-23 LAB — COMPREHENSIVE METABOLIC PANEL
ALT: 24 U/L (ref 0–44)
AST: 21 U/L (ref 15–41)
Albumin: 3.7 g/dL (ref 3.5–5.0)
Alkaline Phosphatase: 88 U/L (ref 38–126)
Anion gap: 10 (ref 5–15)
BUN: 27 mg/dL — ABNORMAL HIGH (ref 8–23)
CO2: 21 mmol/L — ABNORMAL LOW (ref 22–32)
Calcium: 8.6 mg/dL — ABNORMAL LOW (ref 8.9–10.3)
Chloride: 103 mmol/L (ref 98–111)
Creatinine, Ser: 1.36 mg/dL — ABNORMAL HIGH (ref 0.61–1.24)
GFR, Estimated: 58 mL/min — ABNORMAL LOW (ref >60)
Glucose, Bld: 287 mg/dL — ABNORMAL HIGH (ref 70–99)
Potassium: 3.7 mmol/L (ref 3.5–5.1)
Sodium: 134 mmol/L — ABNORMAL LOW (ref 135–145)
Total Bilirubin: 0.3 mg/dL (ref 0.3–1.2)
Total Protein: 6.7 g/dL (ref 6.5–8.1)

## 2021-05-23 LAB — CBC WITH DIFFERENTIAL/PLATELET
Abs Immature Granulocytes: 0.14 10*3/uL — ABNORMAL HIGH (ref 0.00–0.07)
Basophils Absolute: 0.1 10*3/uL (ref 0.0–0.1)
Basophils Relative: 1 %
Eosinophils Absolute: 0.3 10*3/uL (ref 0.0–0.5)
Eosinophils Relative: 3 %
HCT: 43.3 % (ref 39.0–52.0)
Hemoglobin: 15 g/dL (ref 13.0–17.0)
Immature Granulocytes: 2 %
Lymphocytes Relative: 14 %
Lymphs Abs: 1.3 10*3/uL (ref 0.7–4.0)
MCH: 30.1 pg (ref 26.0–34.0)
MCHC: 34.6 g/dL (ref 30.0–36.0)
MCV: 86.9 fL (ref 80.0–100.0)
Monocytes Absolute: 1 10*3/uL (ref 0.1–1.0)
Monocytes Relative: 11 %
Neutro Abs: 6.4 10*3/uL (ref 1.7–7.7)
Neutrophils Relative %: 69 %
Platelets: 180 10*3/uL (ref 150–400)
RBC: 4.98 MIL/uL (ref 4.22–5.81)
RDW: 13.7 % (ref 11.5–15.5)
WBC: 9.2 10*3/uL (ref 4.0–10.5)
nRBC: 0 % (ref 0.0–0.2)

## 2021-05-23 MED ORDER — SODIUM CHLORIDE 0.9 % IV SOLN
1021.0000 mg/m2/d | INTRAVENOUS | Status: DC
  Administered 2021-05-23: 8500 mg via INTRAVENOUS
  Filled 2021-05-23: qty 170

## 2021-05-23 MED ORDER — LOPERAMIDE HCL 2 MG PO CAPS: 2.0000 mg | ORAL_CAPSULE | ORAL | 1 refills | Status: DC

## 2021-05-23 MED ORDER — MITOMYCIN CHEMO IV INJECTION 20 MG
9.6000 mg/m2 | Freq: Once | INTRAVENOUS | Status: AC
  Administered 2021-05-23: 20 mg via INTRAVENOUS
  Filled 2021-05-23: qty 40

## 2021-05-23 MED ORDER — PROCHLORPERAZINE MALEATE 10 MG PO TABS
10.0000 mg | ORAL_TABLET | Freq: Once | ORAL | Status: AC
  Administered 2021-05-23: 10 mg via ORAL
  Filled 2021-05-23: qty 1

## 2021-05-23 MED ORDER — SODIUM CHLORIDE 0.9 % IV SOLN
Freq: Once | INTRAVENOUS | Status: AC
  Filled 2021-05-23: qty 250
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
## 2021-05-23 NOTE — Patient Instructions (Addendum)
[facility] [place]  Discharge Instructions: ?Thank you for choosing [facility] to provide your oncology and hematology care.  ?If you have a lab appointment with the [facility], please go directly to the [facility] and check in at the registration area. ? ?Wear comfortable clothing and clothing appropriate for easy access to any Portacath or PICC line.  ? ?We strive to give you quality time with your provider. You may need to reschedule your appointment if you arrive late (15 or more minutes).  Arriving late affects you and other patients whose appointments are after yours.  Also, if you miss three or more appointments without notifying the office, you may be dismissed from the clinic at the provider?s discretion.    ?  ?For prescription refill requests, have your pharmacy contact our office and allow 72 hours for refills to be completed.   ? ?Today you received the following chemotherapy and/or immunotherapy agents mitomycin, adrucil  ?  ?To help prevent nausea and vomiting after your treatment, we encourage you to take your nausea medication as directed. ? ?BELOW ARE SYMPTOMS THAT SHOULD BE REPORTED IMMEDIATELY: ?*FEVER GREATER THAN 100.4 F (38 ?C) OR HIGHER ?*CHILLS OR SWEATING ?*NAUSEA AND VOMITING THAT IS NOT CONTROLLED WITH YOUR NAUSEA MEDICATION ?*UNUSUAL SHORTNESS OF BREATH ?*UNUSUAL BRUISING OR BLEEDING ?*URINARY PROBLEMS (pain or burning when urinating, or frequent urination) ?*BOWEL PROBLEMS (unusual diarrhea, constipation, pain near the anus) ?TENDERNESS IN MOUTH AND THROAT WITH OR WITHOUT PRESENCE OF ULCERS (sore throat, sores in mouth, or a toothache) ?UNUSUAL RASH, SWELLING OR PAIN  ?UNUSUAL VAGINAL DISCHARGE OR ITCHING  ? ?Items with * indicate a potential emergency and should be followed up as soon as possible or go to the Emergency Department if any problems should occur. ? ?Please show the CHEMOTHERAPY ALERT CARD or IMMUNOTHERAPY ALERT CARD at  check-in to the Emergency Department and triage nurse. ? ?Should you have questions after your visit or need to cancel or reschedule your appointment, please contact [facility] [place] [facility] [place]  [phone] and follow the prompts.  Office hours are 8:00 a.m. to 4:30 p.m. Monday - Friday. Please note that voicemails left after 4:00 p.m. may not be returned until the following business day.  We are closed weekends and major holidays. You have access to a nurse at all times for urgent questions. Please call the main number to the clinic [phone] and follow the prompts. ? ?For any non-urgent questions, you may also contact your provider using MyChart. We now offer e-Visits for anyone 15 and older to request care online for non-urgent symptoms. For details visit [email or web address]. ?  ?Also download the MyChart app! Go to the app store, search "MyChart", open the app, select [facility], and log in with your MyChart username and password. ? ?Due to Covid, a mask is required upon entering the hospital/clinic. If you do not have a mask, one will be given to you upon arrival. For doctor visits, patients may have 1 support person aged 22 or older with them. For treatment visits, patients cannot have anyone with them due to current Covid guidelines and our immunocompromised population.  ?

## 2021-05-24 ENCOUNTER — Ambulatory Visit
Admission: RE | Admit: 2021-05-24 | Discharge: 2021-05-24 | Disposition: A | Discharge status: Home / Self Care | Payer: Medicare Other | Source: Ambulatory Visit | Attending: Radiation Oncology | Admitting: Radiation Oncology

## 2021-05-24 DIAGNOSIS — C775 Secondary and unspecified malignant neoplasm of intrapelvic lymph nodes: Secondary | ICD-10-CM | POA: Diagnosis not present

## 2021-05-25 ENCOUNTER — Ambulatory Visit
Admission: RE | Admit: 2021-05-25 | Discharge: 2021-05-25 | Disposition: A | Discharge status: Home / Self Care | Payer: Medicare Other | Source: Ambulatory Visit | Attending: Radiation Oncology | Admitting: Radiation Oncology

## 2021-05-25 DIAGNOSIS — C775 Secondary and unspecified malignant neoplasm of intrapelvic lymph nodes: Secondary | ICD-10-CM | POA: Diagnosis not present

## 2021-05-26 ENCOUNTER — Ambulatory Visit
Admission: RE | Admit: 2021-05-26 | Discharge: 2021-05-26 | Disposition: A | Discharge status: Home / Self Care | Payer: Medicare Other | Source: Ambulatory Visit | Attending: Radiation Oncology | Admitting: Radiation Oncology

## 2021-05-26 DIAGNOSIS — C775 Secondary and unspecified malignant neoplasm of intrapelvic lymph nodes: Secondary | ICD-10-CM | POA: Diagnosis not present

## 2021-05-27 ENCOUNTER — Inpatient Hospital Stay: Payer: Medicare Other

## 2021-05-27 ENCOUNTER — Ambulatory Visit
Admission: RE | Admit: 2021-05-27 | Discharge: 2021-05-27 | Disposition: A | Discharge status: Home / Self Care | Payer: Medicare Other | Source: Ambulatory Visit | Attending: Radiation Oncology | Admitting: Radiation Oncology

## 2021-05-27 VITALS — BP 137/74 | HR 96 | Resp 18

## 2021-05-27 DIAGNOSIS — C775 Secondary and unspecified malignant neoplasm of intrapelvic lymph nodes: Secondary | ICD-10-CM | POA: Diagnosis not present

## 2021-05-27 DIAGNOSIS — C210 Malignant neoplasm of anus, unspecified: Secondary | ICD-10-CM

## 2021-05-27 MED ORDER — SODIUM CHLORIDE 0.9% FLUSH
10.0000 mL | INTRAVENOUS | Status: DC | PRN
  Administered 2021-05-27: 10 mL
  Filled 2021-05-27: qty 10

## 2021-05-27 MED ORDER — HEPARIN SOD (PORK) LOCK FLUSH 100 UNIT/ML IV SOLN
500.0000 [IU] | Freq: Once | INTRAVENOUS | Status: AC | PRN
  Administered 2021-05-27: 500 [IU]
  Filled 2021-05-27: qty 5

## 2021-05-30 ENCOUNTER — Ambulatory Visit
Admission: RE | Admit: 2021-05-30 | Discharge: 2021-05-30 | Disposition: A | Discharge status: Home / Self Care | Payer: Medicare Other | Source: Ambulatory Visit | Attending: Radiation Oncology | Admitting: Radiation Oncology

## 2021-05-30 DIAGNOSIS — C775 Secondary and unspecified malignant neoplasm of intrapelvic lymph nodes: Secondary | ICD-10-CM | POA: Diagnosis not present

## 2021-05-31 ENCOUNTER — Ambulatory Visit
Admission: RE | Admit: 2021-05-31 | Discharge: 2021-05-31 | Disposition: A | Discharge status: Home / Self Care | Payer: Medicare Other | Source: Ambulatory Visit | Attending: Radiation Oncology | Admitting: Radiation Oncology

## 2021-05-31 DIAGNOSIS — C775 Secondary and unspecified malignant neoplasm of intrapelvic lymph nodes: Secondary | ICD-10-CM | POA: Diagnosis not present

## 2021-06-01 ENCOUNTER — Ambulatory Visit
Admission: RE | Admit: 2021-06-01 | Discharge: 2021-06-01 | Disposition: A | Discharge status: Home / Self Care | Payer: Medicare Other | Source: Ambulatory Visit | Attending: Radiation Oncology | Admitting: Radiation Oncology

## 2021-06-01 DIAGNOSIS — C775 Secondary and unspecified malignant neoplasm of intrapelvic lymph nodes: Secondary | ICD-10-CM | POA: Diagnosis not present

## 2021-06-02 ENCOUNTER — Telehealth: Payer: Self-pay | Admitting: *Deleted

## 2021-06-02 ENCOUNTER — Ambulatory Visit: Payer: Medicare Other

## 2021-06-02 ENCOUNTER — Ambulatory Visit: Admission: RE | Admit: 2021-06-02 | Payer: Medicare Other | Source: Ambulatory Visit
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
## 2021-06-02 NOTE — Telephone Encounter (Signed)
Pt's wife contacted and pt scheduled for port labs/PA/IVF tomorrow. Wife aware of appointment times.  ?

## 2021-06-03 ENCOUNTER — Inpatient Hospital Stay (HOSPITAL_BASED_OUTPATIENT_CLINIC_OR_DEPARTMENT_OTHER): Payer: Medicare Other | Admitting: Medical Oncology

## 2021-06-03 ENCOUNTER — Inpatient Hospital Stay: Payer: Medicare Other

## 2021-06-03 ENCOUNTER — Other Ambulatory Visit: Payer: Self-pay

## 2021-06-03 ENCOUNTER — Ambulatory Visit: Payer: Medicare Other

## 2021-06-03 VITALS — BP 119/78 | HR 86 | Temp 98.3°F | Resp 16

## 2021-06-03 DIAGNOSIS — R197 Diarrhea, unspecified: Secondary | ICD-10-CM

## 2021-06-03 DIAGNOSIS — K521 Toxic gastroenteritis and colitis: Secondary | ICD-10-CM

## 2021-06-03 DIAGNOSIS — C775 Secondary and unspecified malignant neoplasm of intrapelvic lymph nodes: Secondary | ICD-10-CM | POA: Diagnosis not present

## 2021-06-03 DIAGNOSIS — C210 Malignant neoplasm of anus, unspecified: Secondary | ICD-10-CM

## 2021-06-03 DIAGNOSIS — Z95828 Presence of other vascular implants and grafts: Secondary | ICD-10-CM

## 2021-06-03 DIAGNOSIS — E860 Dehydration: Secondary | ICD-10-CM | POA: Diagnosis not present

## 2021-06-03 DIAGNOSIS — R112 Nausea with vomiting, unspecified: Secondary | ICD-10-CM

## 2021-06-03 DIAGNOSIS — T451X5A Adverse effect of antineoplastic and immunosuppressive drugs, initial encounter: Secondary | ICD-10-CM

## 2021-06-03 LAB — COMPREHENSIVE METABOLIC PANEL
ALT: 24 U/L (ref 0–44)
AST: 25 U/L (ref 15–41)
Albumin: 3.8 g/dL (ref 3.5–5.0)
Alkaline Phosphatase: 83 U/L (ref 38–126)
Anion gap: 11 (ref 5–15)
BUN: 21 mg/dL (ref 8–23)
CO2: 23 mmol/L (ref 22–32)
Calcium: 8.8 mg/dL — ABNORMAL LOW (ref 8.9–10.3)
Chloride: 100 mmol/L (ref 98–111)
Creatinine, Ser: 1.36 mg/dL — ABNORMAL HIGH (ref 0.61–1.24)
GFR, Estimated: 58 mL/min — ABNORMAL LOW (ref >60)
Glucose, Bld: 265 mg/dL — ABNORMAL HIGH (ref 70–99)
Potassium: 3.5 mmol/L (ref 3.5–5.1)
Sodium: 134 mmol/L — ABNORMAL LOW (ref 135–145)
Total Bilirubin: 0.9 mg/dL (ref 0.3–1.2)
Total Protein: 7.5 g/dL (ref 6.5–8.1)

## 2021-06-03 LAB — CBC WITH DIFFERENTIAL/PLATELET
Abs Immature Granulocytes: 0.02 10*3/uL (ref 0.00–0.07)
Basophils Absolute: 0 10*3/uL (ref 0.0–0.1)
Basophils Relative: 1 %
Eosinophils Absolute: 0.2 10*3/uL (ref 0.0–0.5)
Eosinophils Relative: 4 %
HCT: 40.3 % (ref 39.0–52.0)
Hemoglobin: 14.1 g/dL (ref 13.0–17.0)
Immature Granulocytes: 1 %
Lymphocytes Relative: 11 %
Lymphs Abs: 0.5 10*3/uL — ABNORMAL LOW (ref 0.7–4.0)
MCH: 30.2 pg (ref 26.0–34.0)
MCHC: 35 g/dL (ref 30.0–36.0)
MCV: 86.3 fL (ref 80.0–100.0)
Monocytes Absolute: 0.6 10*3/uL (ref 0.1–1.0)
Monocytes Relative: 15 %
Neutro Abs: 3 10*3/uL (ref 1.7–7.7)
Neutrophils Relative %: 68 %
Platelets: 224 10*3/uL (ref 150–400)
RBC: 4.67 MIL/uL (ref 4.22–5.81)
RDW: 14.6 % (ref 11.5–15.5)
WBC: 4.3 10*3/uL (ref 4.0–10.5)
nRBC: 0 % (ref 0.0–0.2)

## 2021-06-03 LAB — MAGNESIUM: Magnesium: 2.3 mg/dL (ref 1.7–2.4)

## 2021-06-03 MED ORDER — ONDANSETRON HCL 4 MG/2ML IJ SOLN
8.0000 mg | Freq: Once | INTRAMUSCULAR | Status: AC
  Administered 2021-06-03: 8 mg via INTRAVENOUS
  Filled 2021-06-03: qty 4

## 2021-06-03 MED ORDER — SODIUM CHLORIDE 0.9 % IV SOLN
INTRAVENOUS | Status: AC
  Filled 2021-06-03 (×2): qty 250

## 2021-06-03 MED ORDER — SODIUM CHLORIDE 0.9 % IV SOLN: 8.0000 mg | Freq: Once | INTRAVENOUS | Status: DC

## 2021-06-03 MED ORDER — HEPARIN SOD (PORK) LOCK FLUSH 100 UNIT/ML IV SOLN
500.0000 [IU] | Freq: Once | INTRAVENOUS | Status: AC
  Administered 2021-06-03: 500 [IU] via INTRAVENOUS
  Filled 2021-06-03: qty 5

## 2021-06-03 MED ORDER — SODIUM CHLORIDE 0.9% FLUSH
10.0000 mL | Freq: Once | INTRAVENOUS | Status: AC
  Administered 2021-06-03: 10 mL via INTRAVENOUS
  Filled 2021-06-03: qty 10

## 2021-06-03 NOTE — Progress Notes (Signed)
Pt presents to [facility] with concerns of nausea, vomiting, and diarrhea x1 week, with worsening of symptoms since Monday. He denies fever. He has not had any diarrhea as of yet, today. He also reports poor appetite.  ?
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
## 2021-06-06 ENCOUNTER — Ambulatory Visit: Payer: Medicare Other

## 2021-06-07 ENCOUNTER — Inpatient Hospital Stay: Payer: Medicare Other

## 2021-06-07 ENCOUNTER — Ambulatory Visit: Payer: Medicare Other | Admitting: Oncology

## 2021-06-07 ENCOUNTER — Ambulatory Visit: Payer: Medicare Other

## 2021-06-07 ENCOUNTER — Other Ambulatory Visit: Payer: Self-pay

## 2021-06-07 ENCOUNTER — Other Ambulatory Visit: Payer: Medicare Other

## 2021-06-07 ENCOUNTER — Inpatient Hospital Stay (HOSPITAL_BASED_OUTPATIENT_CLINIC_OR_DEPARTMENT_OTHER): Payer: Medicare Other | Admitting: Hospice and Palliative Medicine

## 2021-06-07 VITALS — BP 115/79 | HR 82 | Temp 99.2°F | Resp 16

## 2021-06-07 DIAGNOSIS — C210 Malignant neoplasm of anus, unspecified: Secondary | ICD-10-CM

## 2021-06-07 DIAGNOSIS — R197 Diarrhea, unspecified: Secondary | ICD-10-CM

## 2021-06-07 DIAGNOSIS — C775 Secondary and unspecified malignant neoplasm of intrapelvic lymph nodes: Secondary | ICD-10-CM | POA: Diagnosis not present

## 2021-06-07 DIAGNOSIS — Z95828 Presence of other vascular implants and grafts: Secondary | ICD-10-CM

## 2021-06-07 LAB — RAD ONC ARIA SESSION SUMMARY
Course Elapsed Days: 41
Plan Fractions Treated to Date: 24
Plan Prescribed Dose Per Fraction: 1.8 Gy
Plan Total Fractions Prescribed: 30
Plan Total Prescribed Dose: 54 Gy
Reference Point Dosage Given to Date: 43.2 Gy
Reference Point Session Dosage Given: 1.8 Gy
Session Number: 24

## 2021-06-07 LAB — CBC WITH DIFFERENTIAL/PLATELET
Abs Immature Granulocytes: 0.04 10*3/uL (ref 0.00–0.07)
Basophils Absolute: 0 10*3/uL (ref 0.0–0.1)
Basophils Relative: 1 %
Eosinophils Absolute: 0.2 10*3/uL (ref 0.0–0.5)
Eosinophils Relative: 5 %
HCT: 39.5 % (ref 39.0–52.0)
Hemoglobin: 13.9 g/dL (ref 13.0–17.0)
Immature Granulocytes: 1 %
Lymphocytes Relative: 14 %
Lymphs Abs: 0.6 10*3/uL — ABNORMAL LOW (ref 0.7–4.0)
MCH: 30.9 pg (ref 26.0–34.0)
MCHC: 35.2 g/dL (ref 30.0–36.0)
MCV: 87.8 fL (ref 80.0–100.0)
Monocytes Absolute: 1 10*3/uL (ref 0.1–1.0)
Monocytes Relative: 23 %
Neutro Abs: 2.5 10*3/uL (ref 1.7–7.7)
Neutrophils Relative %: 56 %
Platelets: 147 10*3/uL — ABNORMAL LOW (ref 150–400)
RBC: 4.5 MIL/uL (ref 4.22–5.81)
RDW: 15.6 % — ABNORMAL HIGH (ref 11.5–15.5)
WBC: 4.3 10*3/uL (ref 4.0–10.5)
nRBC: 0 % (ref 0.0–0.2)

## 2021-06-07 LAB — COMPREHENSIVE METABOLIC PANEL
ALT: 21 U/L (ref 0–44)
AST: 22 U/L (ref 15–41)
Albumin: 3.6 g/dL (ref 3.5–5.0)
Alkaline Phosphatase: 80 U/L (ref 38–126)
Anion gap: 10 (ref 5–15)
BUN: 14 mg/dL (ref 8–23)
CO2: 21 mmol/L — ABNORMAL LOW (ref 22–32)
Calcium: 8.9 mg/dL (ref 8.9–10.3)
Chloride: 101 mmol/L (ref 98–111)
Creatinine, Ser: 1.29 mg/dL — ABNORMAL HIGH (ref 0.61–1.24)
GFR, Estimated: >60 mL/min (ref >60)
Glucose, Bld: 201 mg/dL — ABNORMAL HIGH (ref 70–99)
Potassium: 3.4 mmol/L — ABNORMAL LOW (ref 3.5–5.1)
Sodium: 132 mmol/L — ABNORMAL LOW (ref 135–145)
Total Bilirubin: 0.5 mg/dL (ref 0.3–1.2)
Total Protein: 7.2 g/dL (ref 6.5–8.1)

## 2021-06-07 LAB — C DIFFICILE QUICK SCREEN W PCR REFLEX
C Diff antigen: NEGATIVE
C Diff interpretation: NOT DETECTED
C Diff toxin: NEGATIVE

## 2021-06-07 MED ORDER — POTASSIUM CHLORIDE CRYS ER 20 MEQ PO TBCR: 20.0000 meq | EXTENDED_RELEASE_TABLET | Freq: Every day | ORAL | 0 refills | Status: DC

## 2021-06-07 MED ORDER — SODIUM CHLORIDE 0.9% FLUSH
10.0000 mL | Freq: Once | INTRAVENOUS | Status: AC
  Administered 2021-06-07: 10 mL via INTRAVENOUS
  Filled 2021-06-07: qty 10

## 2021-06-07 MED ORDER — HYDROCODONE-ACETAMINOPHEN 5-325 MG PO TABS: 1.0000 | ORAL_TABLET | Freq: Four times a day (QID) | ORAL | 0 refills | Status: DC | PRN

## 2021-06-07 MED ORDER — HEPARIN SOD (PORK) LOCK FLUSH 100 UNIT/ML IV SOLN
500.0000 [IU] | Freq: Once | INTRAVENOUS | Status: AC
  Administered 2021-06-07: 500 [IU] via INTRAVENOUS
  Filled 2021-06-07: qty 5

## 2021-06-07 MED ORDER — SODIUM CHLORIDE 0.9 % IV SOLN
INTRAVENOUS | Status: AC
  Filled 2021-06-07: qty 250

## 2021-06-07 NOTE — Progress Notes (Signed)
Pt returns for [facility] follow-up. He reports that diarrhea has decreased, and he provided sample for testing today. Sample appeared soft/formed. Pt also reports mild nausea, but took compazine the AM. Reports good appetite.  ?
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
## 2021-06-08 ENCOUNTER — Other Ambulatory Visit: Payer: Self-pay

## 2021-06-08 ENCOUNTER — Ambulatory Visit
Admission: RE | Admit: 2021-06-08 | Discharge: 2021-06-08 | Disposition: A | Discharge status: Home / Self Care | Payer: Medicare Other | Source: Ambulatory Visit | Attending: Radiation Oncology | Admitting: Radiation Oncology

## 2021-06-08 DIAGNOSIS — C775 Secondary and unspecified malignant neoplasm of intrapelvic lymph nodes: Secondary | ICD-10-CM | POA: Diagnosis not present

## 2021-06-08 LAB — RAD ONC ARIA SESSION SUMMARY
Course Elapsed Days: 42
Plan Fractions Treated to Date: 25
Plan Prescribed Dose Per Fraction: 1.8 Gy
Plan Total Fractions Prescribed: 30
Plan Total Prescribed Dose: 54 Gy
Reference Point Dosage Given to Date: 45 Gy
Reference Point Session Dosage Given: 1.8 Gy
Session Number: 25

## 2021-06-09 ENCOUNTER — Other Ambulatory Visit: Payer: Self-pay

## 2021-06-09 ENCOUNTER — Ambulatory Visit
Admission: RE | Admit: 2021-06-09 | Discharge: 2021-06-09 | Disposition: A | Discharge status: Home / Self Care | Payer: Medicare Other | Source: Ambulatory Visit | Attending: Radiation Oncology | Admitting: Radiation Oncology

## 2021-06-09 DIAGNOSIS — C775 Secondary and unspecified malignant neoplasm of intrapelvic lymph nodes: Secondary | ICD-10-CM | POA: Diagnosis not present

## 2021-06-09 LAB — RAD ONC ARIA SESSION SUMMARY
Course Elapsed Days: 43
Plan Fractions Treated to Date: 26
Plan Prescribed Dose Per Fraction: 1.8 Gy
Plan Total Fractions Prescribed: 30
Plan Total Prescribed Dose: 54 Gy
Reference Point Dosage Given to Date: 46.8 Gy
Reference Point Session Dosage Given: 1.8 Gy
Session Number: 26

## 2021-06-10 ENCOUNTER — Ambulatory Visit
Admission: RE | Admit: 2021-06-10 | Discharge: 2021-06-10 | Disposition: A | Discharge status: Home / Self Care | Payer: Medicare Other | Source: Ambulatory Visit | Attending: Radiation Oncology | Admitting: Radiation Oncology

## 2021-06-10 ENCOUNTER — Ambulatory Visit: Payer: Medicare Other

## 2021-06-10 ENCOUNTER — Other Ambulatory Visit: Payer: Self-pay

## 2021-06-10 DIAGNOSIS — C775 Secondary and unspecified malignant neoplasm of intrapelvic lymph nodes: Secondary | ICD-10-CM | POA: Diagnosis not present

## 2021-06-10 LAB — RAD ONC ARIA SESSION SUMMARY
Course Elapsed Days: 44
Plan Fractions Treated to Date: 27
Plan Prescribed Dose Per Fraction: 1.8 Gy
Plan Total Fractions Prescribed: 30
Plan Total Prescribed Dose: 54 Gy
Reference Point Dosage Given to Date: 48.6 Gy
Reference Point Session Dosage Given: 1.8 Gy
Session Number: 27

## 2021-06-13 ENCOUNTER — Other Ambulatory Visit: Payer: Self-pay

## 2021-06-13 ENCOUNTER — Ambulatory Visit: Payer: Medicare Other

## 2021-06-13 ENCOUNTER — Ambulatory Visit
Admission: RE | Admit: 2021-06-13 | Discharge: 2021-06-13 | Disposition: A | Discharge status: Home / Self Care | Payer: Medicare Other | Source: Ambulatory Visit | Attending: Radiation Oncology | Admitting: Radiation Oncology

## 2021-06-13 DIAGNOSIS — C775 Secondary and unspecified malignant neoplasm of intrapelvic lymph nodes: Secondary | ICD-10-CM | POA: Diagnosis not present

## 2021-06-13 LAB — RAD ONC ARIA SESSION SUMMARY
Course Elapsed Days: 47
Plan Fractions Treated to Date: 28
Plan Prescribed Dose Per Fraction: 1.8 Gy
Plan Total Fractions Prescribed: 30
Plan Total Prescribed Dose: 54 Gy
Reference Point Dosage Given to Date: 50.4 Gy
Reference Point Session Dosage Given: 1.8 Gy
Session Number: 28

## 2021-06-14 ENCOUNTER — Other Ambulatory Visit: Payer: Self-pay

## 2021-06-14 ENCOUNTER — Ambulatory Visit
Admission: RE | Admit: 2021-06-14 | Discharge: 2021-06-14 | Disposition: A | Discharge status: Home / Self Care | Payer: Medicare Other | Source: Ambulatory Visit | Attending: Radiation Oncology | Admitting: Radiation Oncology

## 2021-06-14 DIAGNOSIS — C775 Secondary and unspecified malignant neoplasm of intrapelvic lymph nodes: Secondary | ICD-10-CM | POA: Diagnosis not present

## 2021-06-14 LAB — RAD ONC ARIA SESSION SUMMARY
Course Elapsed Days: 48
Plan Fractions Treated to Date: 29
Plan Prescribed Dose Per Fraction: 1.8 Gy
Plan Total Fractions Prescribed: 30
Plan Total Prescribed Dose: 54 Gy
Reference Point Dosage Given to Date: 52.2 Gy
Reference Point Session Dosage Given: 1.8 Gy
Session Number: 29

## 2021-06-15 ENCOUNTER — Ambulatory Visit
Admission: RE | Admit: 2021-06-15 | Discharge: 2021-06-15 | Disposition: A | Discharge status: Home / Self Care | Payer: Medicare Other | Source: Ambulatory Visit | Attending: Radiation Oncology | Admitting: Radiation Oncology

## 2021-06-15 ENCOUNTER — Other Ambulatory Visit: Payer: Self-pay

## 2021-06-15 DIAGNOSIS — C775 Secondary and unspecified malignant neoplasm of intrapelvic lymph nodes: Secondary | ICD-10-CM | POA: Diagnosis not present

## 2021-06-15 LAB — RAD ONC ARIA SESSION SUMMARY
Course Elapsed Days: 49
Plan Fractions Treated to Date: 30
Plan Prescribed Dose Per Fraction: 1.8 Gy
Plan Total Fractions Prescribed: 30
Plan Total Prescribed Dose: 54 Gy
Reference Point Dosage Given to Date: 54 Gy
Reference Point Session Dosage Given: 1.8 Gy
Session Number: 30

## 2021-06-16 ENCOUNTER — Inpatient Hospital Stay: Payer: Medicare Other

## 2021-06-16 ENCOUNTER — Inpatient Hospital Stay: Payer: Medicare Other | Admitting: Hospice and Palliative Medicine

## 2021-06-21 ENCOUNTER — Inpatient Hospital Stay: Payer: Medicare Other | Attending: Oncology

## 2021-06-21 ENCOUNTER — Encounter: Payer: Self-pay | Admitting: Oncology

## 2021-06-21 ENCOUNTER — Inpatient Hospital Stay: Payer: Medicare Other | Admitting: Oncology

## 2021-06-21 ENCOUNTER — Telehealth: Payer: Self-pay

## 2021-06-21 VITALS — BP 125/71 | HR 63 | Temp 96.8°F | Ht 69.0 in | Wt 190.0 lb

## 2021-06-21 DIAGNOSIS — Z95828 Presence of other vascular implants and grafts: Secondary | ICD-10-CM

## 2021-06-21 DIAGNOSIS — R911 Solitary pulmonary nodule: Secondary | ICD-10-CM | POA: Diagnosis not present

## 2021-06-21 DIAGNOSIS — R918 Other nonspecific abnormal finding of lung field: Secondary | ICD-10-CM | POA: Diagnosis not present

## 2021-06-21 DIAGNOSIS — Z923 Personal history of irradiation: Secondary | ICD-10-CM | POA: Diagnosis not present

## 2021-06-21 DIAGNOSIS — C210 Malignant neoplasm of anus, unspecified: Secondary | ICD-10-CM

## 2021-06-21 DIAGNOSIS — Z9221 Personal history of antineoplastic chemotherapy: Secondary | ICD-10-CM | POA: Diagnosis not present

## 2021-06-21 DIAGNOSIS — Z87891 Personal history of nicotine dependence: Secondary | ICD-10-CM | POA: Insufficient documentation

## 2021-06-21 DIAGNOSIS — R197 Diarrhea, unspecified: Secondary | ICD-10-CM | POA: Diagnosis not present

## 2021-06-21 LAB — CBC WITH DIFFERENTIAL/PLATELET
Abs Immature Granulocytes: 0.13 10*3/uL — ABNORMAL HIGH (ref 0.00–0.07)
Basophils Absolute: 0.1 10*3/uL (ref 0.0–0.1)
Basophils Relative: 1 %
Eosinophils Absolute: 0.3 10*3/uL (ref 0.0–0.5)
Eosinophils Relative: 5 %
HCT: 41.3 % (ref 39.0–52.0)
Hemoglobin: 14.4 g/dL (ref 13.0–17.0)
Immature Granulocytes: 2 %
Lymphocytes Relative: 10 %
Lymphs Abs: 0.7 10*3/uL (ref 0.7–4.0)
MCH: 31.1 pg (ref 26.0–34.0)
MCHC: 34.9 g/dL (ref 30.0–36.0)
MCV: 89.2 fL (ref 80.0–100.0)
Monocytes Absolute: 0.9 10*3/uL (ref 0.1–1.0)
Monocytes Relative: 13 %
Neutro Abs: 4.9 10*3/uL (ref 1.7–7.7)
Neutrophils Relative %: 69 %
Platelets: 248 10*3/uL (ref 150–400)
RBC: 4.63 MIL/uL (ref 4.22–5.81)
RDW: 17.8 % — ABNORMAL HIGH (ref 11.5–15.5)
WBC: 6.9 10*3/uL (ref 4.0–10.5)
nRBC: 0 % (ref 0.0–0.2)

## 2021-06-21 LAB — COMPREHENSIVE METABOLIC PANEL
ALT: 28 U/L (ref 0–44)
AST: 31 U/L (ref 15–41)
Albumin: 3.8 g/dL (ref 3.5–5.0)
Alkaline Phosphatase: 84 U/L (ref 38–126)
Anion gap: 9 (ref 5–15)
BUN: 21 mg/dL (ref 8–23)
CO2: 20 mmol/L — ABNORMAL LOW (ref 22–32)
Calcium: 8.8 mg/dL — ABNORMAL LOW (ref 8.9–10.3)
Chloride: 103 mmol/L (ref 98–111)
Creatinine, Ser: 1.36 mg/dL — ABNORMAL HIGH (ref 0.61–1.24)
GFR, Estimated: 58 mL/min — ABNORMAL LOW (ref >60)
Glucose, Bld: 288 mg/dL — ABNORMAL HIGH (ref 70–99)
Potassium: 4 mmol/L (ref 3.5–5.1)
Sodium: 132 mmol/L — ABNORMAL LOW (ref 135–145)
Total Bilirubin: 1 mg/dL (ref 0.3–1.2)
Total Protein: 7.5 g/dL (ref 6.5–8.1)
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
## 2021-06-21 NOTE — Telephone Encounter (Signed)
Called and spoke with Threasa Beards at Dr. Ines Bloomer office to schedule patient for follow up. Patient is scheduled for 08/16/2021 @ 9 am. Called and informed wife of appt. Advised if this date and time doesn't work to contact Dr. Ines Bloomer office. Patient wife verbalized understanding.  ?

## 2021-06-29 ENCOUNTER — Telehealth: Payer: Self-pay

## 2021-06-29 NOTE — Telephone Encounter (Signed)
FMLA forms completed and signed. Pt requested to pick up forms and he will deliver to his employer. Left message and sent mychart message to let him know about pick up.  ?

## 2021-07-19 ENCOUNTER — Inpatient Hospital Stay: Payer: Medicare Other

## 2021-07-19 DIAGNOSIS — Z95828 Presence of other vascular implants and grafts: Secondary | ICD-10-CM

## 2021-07-19 DIAGNOSIS — C210 Malignant neoplasm of anus, unspecified: Secondary | ICD-10-CM | POA: Diagnosis not present

## 2021-07-19 MED ORDER — HEPARIN SOD (PORK) LOCK FLUSH 100 UNIT/ML IV SOLN
500.0000 [IU] | Freq: Once | INTRAVENOUS | Status: AC
  Administered 2021-07-19: 500 [IU] via INTRAVENOUS
  Filled 2021-07-19: qty 5

## 2021-07-19 MED ORDER — SODIUM CHLORIDE 0.9% FLUSH
10.0000 mL | Freq: Once | INTRAVENOUS | Status: AC
  Administered 2021-07-19: 10 mL via INTRAVENOUS
  Filled 2021-07-19: qty 10

## 2021-07-20 ENCOUNTER — Ambulatory Visit
Admission: RE | Admit: 2021-07-20 | Discharge: 2021-07-20 | Disposition: A | Discharge status: Home / Self Care | Payer: Medicare Other | Source: Ambulatory Visit | Attending: Radiation Oncology | Admitting: Radiation Oncology

## 2021-07-20 ENCOUNTER — Encounter: Payer: Self-pay | Admitting: Radiation Oncology

## 2021-07-20 VITALS — BP 136/80 | HR 78 | Temp 97.5°F | Resp 18 | Ht 69.0 in | Wt 199.1 lb

## 2021-07-20 DIAGNOSIS — C210 Malignant neoplasm of anus, unspecified: Secondary | ICD-10-CM | POA: Diagnosis present

## 2021-07-20 DIAGNOSIS — Z9221 Personal history of antineoplastic chemotherapy: Secondary | ICD-10-CM | POA: Diagnosis not present

## 2021-07-20 DIAGNOSIS — Z923 Personal history of irradiation: Secondary | ICD-10-CM | POA: Insufficient documentation

## 2021-07-20 NOTE — Progress Notes (Signed)
Radiation Oncology Follow up Note  Name: Joseph Mcgee   Date:   07/20/2021 MRN:  [id number] DOB: 09-12-1955    This 66 y.o. 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  Seen today in routine follow-up he is improving.  He still has frequent bowel movements not necessarily diarrhea he is having no increased lower urinary tract symptoms.  The irritation in his anal area has markedly improved which i was a significant problem during treatment..  COMPLICATIONS OF TREATMENT: none  FOLLOW UP COMPLIANCE: keeps appointments   PHYSICAL EXAM:  BP 136/80   Pulse 78   Temp (!) 97.5 F (36.4 C)   Resp 18   Ht '5\' 9"'$  (1.753 m)   Wt 199 lb 1.6 oz (90.3 kg)   BMI 29.40 kg/m  On anal examination no evidence of skin breakdown is noted no evidence of mass or nodularity in the anal canal is noted.  No inguinal adenopathy is appreciated.  Well-developed well-nourished patient in NAD. HEENT reveals PERLA, EOMI, discs not visualized.  Oral cavity is clear. No oral mucosal lesions are identified. Neck is clear without evidence of cervical or supraclavicular adenopathy. Lungs are clear to A&P. Cardiac examination is essentially unremarkable with regular rate and rhythm without murmur rub or thrill. Abdomen is benign with no organomegaly or masses noted. Motor sensory and DTR levels are equal and symmetric in the upper and lower extremities. Cranial nerves II through XII are grossly intact. Proprioception is intact. No peripheral adenopathy or edema is identified. No motor or sensory levels are noted. Crude visual fields are within normal  range.  RADIOLOGY RESULTS: No current films for review  PLAN: Present time patient is recovering well from his concurrent chemoradiation therapy I detect no clinical evidence of disease at this time.  I have assured him his bowels will improve with time.  Of asked to see him back in 4 to 5 months for follow-up.  Patient knows to call with any concerns.  I would like to take this opportunity to thank you for allowing me to participate in the care of your patient.Noreene Filbert, MD

## 2021-08-06 ENCOUNTER — Other Ambulatory Visit: Payer: Self-pay | Admitting: Nurse Practitioner

## 2021-08-30 ENCOUNTER — Encounter: Payer: Self-pay | Admitting: Oncology

## 2021-08-30 NOTE — Telephone Encounter (Signed)
Please advise 

## 2021-08-31 NOTE — Telephone Encounter (Signed)
Contacted patient. He states that diarrhea is better this morning. He does not wish to be seen at this time, but will let us know if anything changes or diarrhea returns. Encouraged oral hydration and electrolyte beverages. Pt verbalized understanding.

## 2021-09-13 ENCOUNTER — Inpatient Hospital Stay: Payer: Medicare Other | Attending: Oncology

## 2021-09-13 DIAGNOSIS — C210 Malignant neoplasm of anus, unspecified: Secondary | ICD-10-CM | POA: Insufficient documentation

## 2021-09-13 DIAGNOSIS — Z95828 Presence of other vascular implants and grafts: Secondary | ICD-10-CM

## 2021-09-13 DIAGNOSIS — Z452 Encounter for adjustment and management of vascular access device: Secondary | ICD-10-CM | POA: Diagnosis present

## 2021-09-13 MED ORDER — SODIUM CHLORIDE 0.9% FLUSH
10.0000 mL | INTRAVENOUS | Status: DC | PRN
  Administered 2021-09-13: 10 mL via INTRAVENOUS
  Filled 2021-09-13: qty 10

## 2021-09-13 MED ORDER — HEPARIN SOD (PORK) LOCK FLUSH 100 UNIT/ML IV SOLN
500.0000 [IU] | Freq: Once | INTRAVENOUS | Status: AC
  Administered 2021-09-13: 500 [IU] via INTRAVENOUS
  Filled 2021-09-13: qty 5

## 2021-09-14 DIAGNOSIS — C210 Malignant neoplasm of anus, unspecified: Secondary | ICD-10-CM

## 2021-09-14 NOTE — Progress Notes (Signed)
Survivorship Care Plan visit completed.  Treatment summary reviewed and given to patient.  ASCO answers booklet reviewed and given to patient.  CARE program and Cancer Transitions discussed with patient along with other resources cancer center offers to patients and caregivers.  Patient verbalized understanding.    

## 2021-09-21 ENCOUNTER — Telehealth: Payer: Self-pay

## 2021-09-21 ENCOUNTER — Inpatient Hospital Stay: Payer: Medicare Other

## 2021-09-21 ENCOUNTER — Encounter: Payer: Self-pay | Admitting: Oncology

## 2021-09-21 ENCOUNTER — Inpatient Hospital Stay: Payer: Medicare Other | Attending: Oncology | Admitting: Oncology

## 2021-09-21 VITALS — BP 143/92 | HR 79 | Temp 97.3°F | Wt 199.6 lb

## 2021-09-21 DIAGNOSIS — N1831 Chronic kidney disease, stage 3a: Secondary | ICD-10-CM

## 2021-09-21 DIAGNOSIS — C210 Malignant neoplasm of anus, unspecified: Secondary | ICD-10-CM | POA: Diagnosis present

## 2021-09-21 DIAGNOSIS — Z87891 Personal history of nicotine dependence: Secondary | ICD-10-CM | POA: Insufficient documentation

## 2021-09-21 DIAGNOSIS — N183 Chronic kidney disease, stage 3 unspecified: Secondary | ICD-10-CM | POA: Insufficient documentation

## 2021-09-21 DIAGNOSIS — Z95828 Presence of other vascular implants and grafts: Secondary | ICD-10-CM | POA: Diagnosis not present

## 2021-09-21 LAB — CBC WITH DIFFERENTIAL/PLATELET
Abs Immature Granulocytes: 0.02 10*3/uL (ref 0.00–0.07)
Basophils Absolute: 0.1 10*3/uL (ref 0.0–0.1)
Basophils Relative: 1 %
Eosinophils Absolute: 1 10*3/uL — ABNORMAL HIGH (ref 0.0–0.5)
Eosinophils Relative: 13 %
HCT: 46.9 % (ref 39.0–52.0)
Hemoglobin: 15.9 g/dL (ref 13.0–17.0)
Immature Granulocytes: 0 %
Lymphocytes Relative: 15 %
Lymphs Abs: 1.1 10*3/uL (ref 0.7–4.0)
MCH: 31.7 pg (ref 26.0–34.0)
MCHC: 33.9 g/dL (ref 30.0–36.0)
MCV: 93.4 fL (ref 80.0–100.0)
Monocytes Absolute: 0.9 10*3/uL (ref 0.1–1.0)
Monocytes Relative: 12 %
Neutro Abs: 4.5 10*3/uL (ref 1.7–7.7)
Neutrophils Relative %: 59 %
Platelets: 225 10*3/uL (ref 150–400)
RBC: 5.02 MIL/uL (ref 4.22–5.81)
RDW: 12.1 % (ref 11.5–15.5)
WBC: 7.6 10*3/uL (ref 4.0–10.5)
nRBC: 0 % (ref 0.0–0.2)

## 2021-09-21 NOTE — Assessment & Plan Note (Signed)
Avoid nephrotoxins.  Encourage oral hydration Optimize glycemic control 
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
## 2021-09-21 NOTE — Assessment & Plan Note (Signed)
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 DRE/anoscopy by surgery 8 weeks after concurrent chemotherapy and radiation showed complete response. Recommend patient to continue follow-up with surgery for DRE/anoscopy/inguinal node palpation every 6 months x 2-3 years,  Obtain surveillance imaging CT chest abdomen pelvis with contrast After that, annual imaging x 3 years.

## 2021-09-21 NOTE — Assessment & Plan Note (Signed)
Recommend port flush every 8 weeks.  

## 2021-09-21 NOTE — Telephone Encounter (Signed)
Faxed leave of absence return to work form to [employer].

## 2021-09-29 ENCOUNTER — Encounter: Payer: Self-pay | Admitting: Oncology

## 2021-10-03 ENCOUNTER — Telehealth: Payer: Self-pay

## 2021-10-03 NOTE — Telephone Encounter (Signed)
error 

## 2021-10-13 ENCOUNTER — Ambulatory Visit: Payer: Medicare Other

## 2021-10-14 ENCOUNTER — Ambulatory Visit
Admission: RE | Admit: 2021-10-14 | Discharge: 2021-10-14 | Disposition: A | Discharge status: Home / Self Care | Payer: Medicare Other | Source: Ambulatory Visit | Attending: Oncology | Admitting: Oncology

## 2021-10-14 ENCOUNTER — Ambulatory Visit
Admission: RE | Admit: 2021-10-14 | Discharge: 2021-10-14 | Disposition: A | Discharge status: Home / Self Care | Payer: Medicare Other | Source: Ambulatory Visit | Attending: Acute Care | Admitting: Acute Care

## 2021-10-14 DIAGNOSIS — Z87891 Personal history of nicotine dependence: Secondary | ICD-10-CM | POA: Insufficient documentation

## 2021-10-14 DIAGNOSIS — C210 Malignant neoplasm of anus, unspecified: Secondary | ICD-10-CM | POA: Diagnosis present

## 2021-10-14 MED ORDER — IOHEXOL 300 MG/ML  SOLN
85.0000 mL | Freq: Once | INTRAMUSCULAR | Status: AC | PRN
Start: 2021-10-14 — End: 2021-10-14
  Administered 2021-10-14: 85 mL via INTRAVENOUS

## 2021-10-16 ENCOUNTER — Other Ambulatory Visit: Payer: Self-pay | Admitting: Oncology

## 2021-10-17 ENCOUNTER — Other Ambulatory Visit: Payer: Self-pay | Admitting: Acute Care

## 2021-10-17 DIAGNOSIS — Z122 Encounter for screening for malignant neoplasm of respiratory organs: Secondary | ICD-10-CM

## 2021-10-17 DIAGNOSIS — Z87891 Personal history of nicotine dependence: Secondary | ICD-10-CM

## 2021-11-08 ENCOUNTER — Inpatient Hospital Stay: Payer: Medicare Other

## 2021-11-09 ENCOUNTER — Other Ambulatory Visit: Payer: Self-pay

## 2021-11-09 ENCOUNTER — Inpatient Hospital Stay: Payer: Medicare Other | Attending: Oncology

## 2021-11-09 DIAGNOSIS — Z95828 Presence of other vascular implants and grafts: Secondary | ICD-10-CM

## 2021-11-09 DIAGNOSIS — C210 Malignant neoplasm of anus, unspecified: Secondary | ICD-10-CM | POA: Insufficient documentation

## 2021-11-09 DIAGNOSIS — Z452 Encounter for adjustment and management of vascular access device: Secondary | ICD-10-CM | POA: Diagnosis present

## 2021-11-09 MED ORDER — SODIUM CHLORIDE 0.9% FLUSH
10.0000 mL | Freq: Once | INTRAVENOUS | Status: AC
  Administered 2021-11-09: 10 mL via INTRAVENOUS
  Filled 2021-11-09: qty 10

## 2021-11-09 MED ORDER — LIDOCAINE-PRILOCAINE 2.5-2.5 % EX CREA: TOPICAL_CREAM | CUTANEOUS | 3 refills | Status: DC

## 2021-11-09 MED ORDER — HEPARIN SOD (PORK) LOCK FLUSH 100 UNIT/ML IV SOLN
500.0000 [IU] | Freq: Once | INTRAVENOUS | Status: AC
  Administered 2021-11-09: 500 [IU] via INTRAVENOUS
  Filled 2021-11-09: qty 5

## 2021-12-23 ENCOUNTER — Ambulatory Visit: Payer: Medicare Other | Admitting: Radiation Oncology

## 2022-01-02 ENCOUNTER — Encounter: Payer: Self-pay | Admitting: Radiation Oncology

## 2022-01-02 ENCOUNTER — Encounter: Payer: Self-pay | Admitting: *Deleted

## 2022-01-02 ENCOUNTER — Ambulatory Visit
Admission: RE | Admit: 2022-01-02 | Discharge: 2022-01-02 | Disposition: A | Discharge status: Home / Self Care | Payer: Medicare Other | Source: Ambulatory Visit | Attending: Radiation Oncology | Admitting: Radiation Oncology

## 2022-01-02 ENCOUNTER — Inpatient Hospital Stay: Payer: Medicare Other

## 2022-01-02 VITALS — BP 133/87 | HR 66 | Temp 97.7°F | Ht 69.0 in | Wt 199.6 lb

## 2022-01-02 DIAGNOSIS — Z95828 Presence of other vascular implants and grafts: Secondary | ICD-10-CM

## 2022-01-02 DIAGNOSIS — Z87442 Personal history of urinary calculi: Secondary | ICD-10-CM | POA: Diagnosis not present

## 2022-01-02 DIAGNOSIS — Z452 Encounter for adjustment and management of vascular access device: Secondary | ICD-10-CM | POA: Insufficient documentation

## 2022-01-02 DIAGNOSIS — N200 Calculus of kidney: Secondary | ICD-10-CM | POA: Insufficient documentation

## 2022-01-02 DIAGNOSIS — Z923 Personal history of irradiation: Secondary | ICD-10-CM | POA: Diagnosis not present

## 2022-01-02 DIAGNOSIS — C210 Malignant neoplasm of anus, unspecified: Secondary | ICD-10-CM | POA: Insufficient documentation

## 2022-01-02 MED ORDER — HEPARIN SOD (PORK) LOCK FLUSH 100 UNIT/ML IV SOLN
500.0000 [IU] | Freq: Once | INTRAVENOUS | Status: AC
  Administered 2022-01-02: 500 [IU] via INTRAVENOUS
  Filled 2022-01-02: qty 5

## 2022-01-02 MED ORDER — SODIUM CHLORIDE 0.9% FLUSH
10.0000 mL | Freq: Once | INTRAVENOUS | Status: AC
  Administered 2022-01-02: 10 mL via INTRAVENOUS
  Filled 2022-01-02: qty 10

## 2022-01-02 NOTE — Progress Notes (Signed)
Radiation Oncology Follow up Note  Name: Joseph Mcgee   Date:   01/02/2022 MRN:  [id number] DOB: [birth year]    This 66 y.o. male presents to the clinic today for 42-monthfollow-up status post concurrent chemoradiation therapy for stage IIa (T2 N0 M0) squamous cell carcinoma the anus with positive margins after resection  REFERRING PROVIDER: LDion Body MD  HPI: Patient is a 66year old male now at 6 months having completed concurrent chemoradiation therapy for squamous cell carcinoma of the anus.  He is seen today in follow-up.  He states he is not having specific diarrhea although he does have frequent bowel movements.  Does not follow any low residue diet rarely takes Imodium except when necessary.  He continues to have some burning on urination does have a history of renal calculi is also seeing some hematuria.  He tried Azo although he does not take anything for his dysuria..  Patient CT scan of chest abdomen and pelvis back in August showed no evidence of recurrent metastatic carcinoma within the abdomen or pelvis does have bilateral nephrolithiasis  COMPLICATIONS OF TREATMENT: none  FOLLOW UP COMPLIANCE: keeps appointments   PHYSICAL EXAM:  BP 133/87   Pulse 66   Temp 97.7 F (36.5 C)   Ht '5\' 9"'$  (1.753 m)   Wt 199 lb 9.6 oz (90.5 kg)   BMI 29.48 kg/m  On rectal exam patient has no evidence of nodularity in the anal region.  Well-developed well-nourished patient in NAD. HEENT reveals PERLA, EOMI, discs not visualized.  Oral cavity is clear. No oral mucosal lesions are identified. Neck is clear without evidence of cervical or supraclavicular adenopathy. Lungs are clear to A&P. Cardiac examination is essentially unremarkable with regular rate and rhythm without murmur rub or thrill. Abdomen is benign with no organomegaly or masses noted. Motor sensory and DTR levels are equal and symmetric in the upper and lower extremities. Cranial nerves II through XII are grossly intact.  Proprioception is intact. No peripheral adenopathy or edema is identified. No motor or sensory levels are noted. Crude visual fields are within normal range.  RADIOLOGY RESULTS: CT scans of chest abdomen pelvis reviewed compatible with above-stated findings  PLAN: Patient is doing well at this time with no evidence of disease.  I am referring him to urology he has seen Dr. BErlene Quanin the past for his nephrolithiasis hematuria and dysuria.  I have otherwise asked to see him back in 6 months for follow-up.  Patient is to call with any concerns.  I would like to take this opportunity to thank you for allowing me to participate in the care of your patient..Noreene Filbert MD

## 2022-01-03 ENCOUNTER — Inpatient Hospital Stay: Payer: Medicare Other
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
## 2022-01-24 ENCOUNTER — Encounter: Payer: Self-pay | Admitting: Urology

## 2022-01-24 ENCOUNTER — Ambulatory Visit (INDEPENDENT_AMBULATORY_CARE_PROVIDER_SITE_OTHER): Payer: Medicare Other | Admitting: Urology

## 2022-01-24 VITALS — BP 121/81 | HR 98 | Ht 69.0 in | Wt 199.0 lb

## 2022-01-24 DIAGNOSIS — R300 Dysuria: Secondary | ICD-10-CM | POA: Diagnosis not present

## 2022-01-24 DIAGNOSIS — R319 Hematuria, unspecified: Secondary | ICD-10-CM | POA: Diagnosis not present

## 2022-01-24 DIAGNOSIS — N200 Calculus of kidney: Secondary | ICD-10-CM

## 2022-01-24 DIAGNOSIS — Z87442 Personal history of urinary calculi: Secondary | ICD-10-CM

## 2022-01-24 LAB — MICROSCOPIC EXAMINATION

## 2022-01-24 LAB — URINALYSIS, COMPLETE
Bilirubin, UA: NEGATIVE
Ketones, UA: NEGATIVE
Leukocytes,UA: NEGATIVE
Nitrite, UA: NEGATIVE
Protein,UA: NEGATIVE
Specific Gravity, UA: 1.015 (ref 1.005–1.030)
Urobilinogen, Ur: 0.2 mg/dL (ref 0.2–1.0)
pH, UA: 5 (ref 5.0–7.5)

## 2022-01-24 LAB — BLADDER SCAN AMB NON-IMAGING

## 2022-01-24 MED ORDER — TAMSULOSIN HCL 0.4 MG PO CAPS: 0.4000 mg | ORAL_CAPSULE | Freq: Every day | ORAL | 11 refills | Status: DC

## 2022-01-24 NOTE — Patient Instructions (Signed)

## 2022-01-26 LAB — CYTOLOGY - NON PAP

## 2022-02-08 ENCOUNTER — Ambulatory Visit (INDEPENDENT_AMBULATORY_CARE_PROVIDER_SITE_OTHER): Payer: Medicare Other | Admitting: Urology

## 2022-02-08 ENCOUNTER — Other Ambulatory Visit: Payer: Self-pay | Admitting: Urology

## 2022-02-08 VITALS — BP 130/80 | HR 80 | Ht 69.0 in | Wt 201.1 lb

## 2022-02-08 DIAGNOSIS — N35011 Post-traumatic bulbous urethral stricture: Secondary | ICD-10-CM

## 2022-02-08 DIAGNOSIS — R300 Dysuria: Secondary | ICD-10-CM
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
## 2022-02-08 NOTE — Progress Notes (Signed)
Surgical Physician Order Form [facility]  * Scheduling expectation : Next Available  *Length of Case:   *Clearance needed: no  *Anticoagulation Instructions: Hold all anticoagulants  *Aspirin Instructions: Ok to continue Aspirin  *Post-op visit Date/Instructions:   3 days for foley removal, 6 weeks IPSS/ PVR  *Diagnosis: Ureteral Stricture  *Procedure: Urethral balloon dilation, cystoscopy   Additional orders: N/A  -Admit type: OUTpatient  -Anesthesia: General  -VTE Prophylaxis Standing Order SCD's       Other:   -Standing Lab Orders Per Anesthesia    Lab other: None  -Standing Test orders EKG/Chest x-Halen per Anesthesia       Test other:   - Medications:  Ancef 2gm IV  -Other orders:  N/A

## 2022-02-09 ENCOUNTER — Telehealth: Payer: Self-pay

## 2022-02-09 NOTE — Progress Notes (Signed)
   [facility]  Surgery Date: Date: 02/23/2022  Surgeon: Dr. Hollice Espy, MD  Inpt ( No  )   Outpt (Yes)   Obs ( No  )   Diagnosis: N35.011 Urethral Stricture  -CPT: 66815  Surgery: Cystoscopy with Urethral Balloon Dilation  Stop Anticoagulations: Yes, may continue ASA  Cardiac/Medical/Pulmonary Clearance needed: no  *Orders entered into EPIC  Date: 02/09/22   *Case booked in [place]  Date: 02/09/22  *Notified pt of Surgery: Date: 02/09/22  PRE-OP UA & CX: no  *Placed into Prior Authorization Work [place] Date: 02/09/22  Assistant/laser/rep:No

## 2022-02-09 NOTE — Telephone Encounter (Signed)
I spoke with Joseph Mcgee and his wife. We have discussed possible surgery dates and Thursday January 4th, 2024 was agreed upon by all parties. Patient given information about surgery date, what to expect pre-operatively and post operatively.  We discussed that a Pre-Admission Testing office will be calling to set up the pre-op visit that will take place prior to surgery, and that these appointments are typically done over the phone with a Pre-Admissions RN. Informed patient that our office will communicate any additional care to be provided after surgery. Patients questions or concerns were discussed during our call. Advised to call our office should there be any additional information, questions or concerns that arise. Patient verbalized understanding.

## 2022-02-22 ENCOUNTER — Encounter
Admission: RE | Admit: 2022-02-22 | Discharge: 2022-02-22 | Disposition: A | Discharge status: Home / Self Care | Payer: Medicare Other | Source: Ambulatory Visit | Attending: Urology | Admitting: Urology

## 2022-02-22 VITALS — Ht 69.0 in | Wt 201.0 lb

## 2022-02-22 DIAGNOSIS — I129 Hypertensive chronic kidney disease with stage 1 through stage 4 chronic kidney disease, or unspecified chronic kidney disease: Secondary | ICD-10-CM | POA: Insufficient documentation

## 2022-02-22 DIAGNOSIS — Z01812 Encounter for preprocedural laboratory examination: Secondary | ICD-10-CM

## 2022-02-22 DIAGNOSIS — Z794 Long term (current) use of insulin: Secondary | ICD-10-CM | POA: Insufficient documentation

## 2022-02-22 DIAGNOSIS — I4510 Unspecified right bundle-branch block: Secondary | ICD-10-CM | POA: Diagnosis not present

## 2022-02-22 DIAGNOSIS — E1122 Type 2 diabetes mellitus with diabetic chronic kidney disease: Secondary | ICD-10-CM | POA: Insufficient documentation

## 2022-02-22 DIAGNOSIS — N183 Chronic kidney disease, stage 3 unspecified: Secondary | ICD-10-CM | POA: Insufficient documentation

## 2022-02-22 DIAGNOSIS — Z01818 Encounter for other preprocedural examination: Secondary | ICD-10-CM | POA: Insufficient documentation

## 2022-02-22 LAB — CBC
HCT: 51.2 % (ref 39.0–52.0)
Hemoglobin: 17 g/dL (ref 13.0–17.0)
MCH: 29.4 pg (ref 26.0–34.0)
MCHC: 33.2 g/dL (ref 30.0–36.0)
MCV: 88.4 fL (ref 80.0–100.0)
Platelets: 248 10*3/uL (ref 150–400)
RBC: 5.79 MIL/uL (ref 4.22–5.81)
RDW: 13.4 % (ref 11.5–15.5)
WBC: 7.6 10*3/uL (ref 4.0–10.5)
nRBC: 0 % (ref 0.0–0.2)

## 2022-02-22 LAB — POTASSIUM: Potassium: 3.5 mmol/L (ref 3.5–5.1)

## 2022-02-22 MED ORDER — CHLORHEXIDINE GLUCONATE 0.12 % MT SOLN: 15.0000 mL | Freq: Once | OROMUCOSAL | Status: AC

## 2022-02-22 MED ORDER — CEFAZOLIN SODIUM-DEXTROSE 2-4 GM/100ML-% IV SOLN
2.0000 g | INTRAVENOUS | Status: AC
  Administered 2022-02-23: 2 g via INTRAVENOUS

## 2022-02-22 MED ORDER — ORAL CARE MOUTH RINSE: 15.0000 mL | Freq: Once | OROMUCOSAL | Status: AC

## 2022-02-22 MED ORDER — FAMOTIDINE 20 MG PO TABS: 20.0000 mg | ORAL_TABLET | Freq: Once | ORAL | Status: AC

## 2022-02-22 MED ORDER — SODIUM CHLORIDE 0.9 % IV SOLN: INTRAVENOUS | Status: DC

## 2022-02-22 NOTE — Patient Instructions (Addendum)
Your procedure is scheduled on: Thursday, January 4 Report to the Registration Desk on the 1st floor of the [facility]. To find out your arrival time, please call [phone] between 1PM - 3PM on: Wednesday, January 3 If your arrival time is 6:00 am, do not arrive prior to that time as the [place] entrance doors do not open until 6:00 am.  REMEMBER: Instructions that are not followed completely may result in serious medical risk, up to and including death; or upon the discretion of your surgeon and anesthesiologist your surgery may need to be rescheduled.  Do not eat or drink after midnight the night before surgery.  No gum chewing, lozengers or hard candies.  TAKE THESE MEDICATIONS THE MORNING OF SURGERY WITH A SIP OF WATER:  Ezetimibe (Zetia)  Farxiga - hold 3 days prior to surgery  Metformin - hold 2 days prior to surgery.  Toujeo insulin - only take half of your normal dose the night before surgery. Only take 30 units on Wednesday night, January 3.  Do not take any insulin on the morning of surgery.  One week prior to surgery: Stop Anti-inflammatories (NSAIDS) such as Advil, Aleve, Ibuprofen, Motrin, Naproxen, Naprosyn and Aspirin based products such as Excedrin, Goodys Powder, BC Powder. Stop ANY OVER THE COUNTER supplements until after surgery. Stop fish oil, zinc. You may however, continue to take Tylenol if needed for pain up until the day of surgery.  No Alcohol for 24 hours before or after surgery.  No Smoking including e-cigarettes for 24 hours prior to surgery.  No chewable tobacco products for at least 6 hours prior to surgery.  No nicotine patches on the day of surgery.  Do not use any "recreational" drugs for at least a week prior to your surgery.  Please be advised that the combination of cocaine and anesthesia may have negative outcomes, up to and including death. If you test positive for cocaine, your surgery will be cancelled.  On the morning of  surgery brush your teeth with toothpaste and water, you may rinse your mouth with mouthwash if you wish. Do not swallow any toothpaste or mouthwash.  Do not wear jewelry, make-up, hairpins, clips or nail polish.  Do not wear lotions, powders, or perfumes.   Do not shave body from the neck down 48 hours prior to surgery just in case you cut yourself which could leave a site for infection.   Contact lenses, hearing aids and dentures may not be worn into surgery.  Do not bring valuables to the hospital. [facility] is not responsible for any missing/lost belongings or valuables.   Notify your doctor if there is any change in your medical condition (cold, fever, infection).  Wear comfortable clothing (specific to your surgery type) to the hospital.  After surgery, you can help prevent lung complications by doing breathing exercises.  Take deep breaths and cough every 1-2 hours. Your doctor may order a device called an Incentive Spirometer to help you take deep breaths.  If you are being discharged the day of surgery, you will not be allowed to drive home. You will need a responsible adult (18 years or older) to drive you home and stay with you that night.   If you are taking public transportation, you will need to have a responsible adult (18 years or older) with you. Please confirm with your physician that it is acceptable to use public transportation.   Please call the [place] Dept. at [phone] if  you have any questions about these instructions.  Surgery Visitation Policy:  Patients undergoing a surgery or procedure may have two family members or support persons with them as long as the person is not COVID-19 positive or experiencing its symptoms.

## 2022-02-23 ENCOUNTER — Ambulatory Visit: Payer: Medicare Other | Admitting: Certified Registered"

## 2022-02-23 ENCOUNTER — Encounter: Payer: Self-pay | Admitting: Urology

## 2022-02-23 ENCOUNTER — Encounter
Admission: RE | Disposition: A | Discharge status: Home / Self Care | Payer: Self-pay | Source: Ambulatory Visit | Attending: Urology

## 2022-02-23 ENCOUNTER — Other Ambulatory Visit: Payer: Self-pay

## 2022-02-23 ENCOUNTER — Ambulatory Visit
Admission: RE | Admit: 2022-02-23 | Discharge: 2022-02-23 | Disposition: A | Discharge status: Home / Self Care | Payer: Medicare Other | Source: Ambulatory Visit | Attending: Urology | Admitting: Urology

## 2022-02-23 DIAGNOSIS — Z923 Personal history of irradiation: Secondary | ICD-10-CM | POA: Insufficient documentation

## 2022-02-23 DIAGNOSIS — Z01812 Encounter for preprocedural laboratory examination: Secondary | ICD-10-CM

## 2022-02-23 DIAGNOSIS — Z87891 Personal history of nicotine dependence: Secondary | ICD-10-CM | POA: Diagnosis not present

## 2022-02-23 DIAGNOSIS — J449 Chronic obstructive pulmonary disease, unspecified: Secondary | ICD-10-CM | POA: Insufficient documentation

## 2022-02-23 DIAGNOSIS — R300 Dysuria: Secondary | ICD-10-CM

## 2022-02-23 DIAGNOSIS — I10 Essential (primary) hypertension: Secondary | ICD-10-CM | POA: Diagnosis not present

## 2022-02-23 DIAGNOSIS — N35912 Unspecified bulbous urethral stricture, male: Secondary | ICD-10-CM

## 2022-02-23 DIAGNOSIS — Z7984 Long term (current) use of oral hypoglycemic drugs: Secondary | ICD-10-CM | POA: Insufficient documentation

## 2022-02-23 DIAGNOSIS — E1165 Type 2 diabetes mellitus with hyperglycemia: Secondary | ICD-10-CM | POA: Insufficient documentation

## 2022-02-23 DIAGNOSIS — I700 Atherosclerosis of aorta: Secondary | ICD-10-CM | POA: Diagnosis not present

## 2022-02-23 DIAGNOSIS — Z85048 Personal history of other malignant neoplasm of rectum, rectosigmoid junction, and anus: Secondary | ICD-10-CM | POA: Diagnosis not present

## 2022-02-23 DIAGNOSIS — Z794 Long term (current) use of insulin: Secondary | ICD-10-CM | POA: Diagnosis not present

## 2022-02-23 DIAGNOSIS — Z79899 Other long term (current) drug therapy: Secondary | ICD-10-CM | POA: Diagnosis not present

## 2022-02-23 DIAGNOSIS — N183 Chronic kidney disease, stage 3 unspecified: Secondary | ICD-10-CM

## 2022-02-23 DIAGNOSIS — N35011 Post-traumatic bulbous urethral stricture: Secondary | ICD-10-CM

## 2022-02-23 HISTORY — PX: CYSTOSCOPY WITH URETHRAL DILATATION: SHX5125

## 2022-02-23 LAB — GLUCOSE, CAPILLARY
Glucose-Capillary: 164 mg/dL — ABNORMAL HIGH (ref 70–99)
Glucose-Capillary: 181 mg/dL — ABNORMAL HIGH (ref 70–99)

## 2022-02-23 SURGERY — CYSTOSCOPY, WITH URETHRAL DILATION
Anesthesia: General

## 2022-02-23 MED ORDER — FENTANYL CITRATE (PF) 100 MCG/2ML IJ SOLN: 25.0000 ug | INTRAMUSCULAR | Status: DC | PRN

## 2022-02-23 MED ORDER — ONDANSETRON HCL 4 MG/2ML IJ SOLN: 4.0000 mg | Freq: Once | INTRAMUSCULAR | Status: DC | PRN

## 2022-02-23 MED ORDER — CHLORHEXIDINE GLUCONATE 0.12 % MT SOLN
OROMUCOSAL | Status: AC
  Administered 2022-02-23: 15 mL via OROMUCOSAL
  Filled 2022-02-23: qty 15

## 2022-02-23 MED ORDER — PROPOFOL 10 MG/ML IV BOLUS
INTRAVENOUS | Status: AC
  Filled 2022-02-23: qty 20

## 2022-02-23 MED ORDER — STERILE WATER FOR IRRIGATION IR SOLN
Status: DC | PRN
  Administered 2022-02-23: 3000 mL

## 2022-02-23 MED ORDER — LIDOCAINE HCL (PF) 2 % IJ SOLN
INTRAMUSCULAR | Status: AC
  Filled 2022-02-23: qty 5

## 2022-02-23 MED ORDER — OXYBUTYNIN CHLORIDE 5 MG PO TABS: 5.0000 mg | ORAL_TABLET | Freq: Three times a day (TID) | ORAL | 0 refills | Status: DC | PRN

## 2022-02-23 MED ORDER — MIDAZOLAM HCL 2 MG/2ML IJ SOLN
INTRAMUSCULAR | Status: AC
  Filled 2022-02-23: qty 2

## 2022-02-23 MED ORDER — MIDAZOLAM HCL 2 MG/2ML IJ SOLN
INTRAMUSCULAR | Status: DC | PRN
  Administered 2022-02-23: 2 mg via INTRAVENOUS

## 2022-02-23 MED ORDER — LIDOCAINE HCL (CARDIAC) PF 100 MG/5ML IV SOSY
PREFILLED_SYRINGE | INTRAVENOUS | Status: DC | PRN
  Administered 2022-02-23 (×2): 50 mg via INTRAVENOUS

## 2022-02-23 MED ORDER — DEXAMETHASONE SODIUM PHOSPHATE 4 MG/ML IJ SOLN
INTRAMUSCULAR | Status: DC | PRN
  Administered 2022-02-23: 4 mg via INTRAVENOUS

## 2022-02-23 MED ORDER — FENTANYL CITRATE (PF) 100 MCG/2ML IJ SOLN
INTRAMUSCULAR | Status: DC | PRN
  Administered 2022-02-23 (×4): 25 ug via INTRAVENOUS

## 2022-02-23 MED ORDER — PROPOFOL 10 MG/ML IV BOLUS
INTRAVENOUS | Status: DC | PRN
  Administered 2022-02-23 (×2): 50 mg via INTRAVENOUS

## 2022-02-23 MED ORDER — FAMOTIDINE 20 MG PO TABS
ORAL_TABLET | ORAL | Status: AC
  Administered 2022-02-23: 20 mg via ORAL
  Filled 2022-02-23: qty 1

## 2022-02-23 MED ORDER — ONDANSETRON HCL 4 MG/2ML IJ SOLN
INTRAMUSCULAR | Status: DC | PRN
  Administered 2022-02-23: 4 mg via INTRAVENOUS

## 2022-02-23 MED ORDER — CEFAZOLIN SODIUM-DEXTROSE 2-4 GM/100ML-% IV SOLN
INTRAVENOUS | Status: AC
  Filled 2022-02-23: qty 100

## 2022-02-23 MED ORDER — PROPOFOL 500 MG/50ML IV EMUL
INTRAVENOUS | Status: DC | PRN
  Administered 2022-02-23: 100 ug/kg/min via INTRAVENOUS

## 2022-02-23 MED ORDER — FENTANYL CITRATE (PF) 100 MCG/2ML IJ SOLN
INTRAMUSCULAR | Status: AC
  Filled 2022-02-23: qty 2

## 2022-02-23 MED ORDER — DEXMEDETOMIDINE HCL IN NACL 80 MCG/20ML IV SOLN
INTRAVENOUS | Status: DC | PRN
  Administered 2022-02-23 (×2): 4 ug via BUCCAL

## 2022-02-23 MED ORDER — PROPOFOL 1000 MG/100ML IV EMUL
INTRAVENOUS | Status: AC
  Filled 2022-02-23: qty 100

## 2022-02-23 MED ORDER — DEXMEDETOMIDINE HCL IN NACL 80 MCG/20ML IV SOLN
INTRAVENOUS | Status: AC
  Filled 2022-02-23: qty 20

## 2022-02-23 SURGICAL SUPPLY — 24 items
BAG DRN RND TRDRP ANRFLXCHMBR (UROLOGICAL SUPPLIES) ×1
BAG URINE DRAIN 2000ML AR STRL (UROLOGICAL SUPPLIES) ×1 IMPLANT
BALLN OPTILUME DCB 24X5X75 (BALLOONS)
BALLN OPTILUME DCB 30X3X75 (BALLOONS)
BALLN OPTILUME DCB 30X5X75 (BALLOONS) ×1
BALLOON OPTILUME DCB 24X5X75 (BALLOONS) IMPLANT
BALLOON OPTILUME DCB 30X3X75 (BALLOONS) IMPLANT
BALLOON OPTILUME DCB 30X5X75 (BALLOONS) IMPLANT
CATH FOL 2WAY LX 16X5 (CATHETERS) IMPLANT
CATH FOL 2WAY LX 18X30 (CATHETERS) IMPLANT
CATH URETHRAL DIL 7.0X29 (CATHETERS) ×1 IMPLANT
ELECT REM PT RETURN 9FT ADLT (ELECTROSURGICAL)
ELECTRODE REM PT RTRN 9FT ADLT (ELECTROSURGICAL) IMPLANT
GAUZE 4X4 16PLY ~~LOC~~+RFID DBL (SPONGE) ×2 IMPLANT
GLOVE BIO SURGEON STRL SZ 6.5 (GLOVE) ×1 IMPLANT
GOWN STRL REUS W/ TWL LRG LVL3 (GOWN DISPOSABLE) ×2 IMPLANT
GOWN STRL REUS W/TWL LRG LVL3 (GOWN DISPOSABLE) ×2
GUIDEWIRE STR DUAL SENSOR (WIRE) ×1 IMPLANT
PACK CYSTO AR (MISCELLANEOUS) ×1 IMPLANT
SET CYSTO W/LG BORE CLAMP LF (SET/KITS/TRAYS/PACK) ×1 IMPLANT
SYR 10ML LL (SYRINGE) ×1 IMPLANT
SYR 30ML LL (SYRINGE) ×1 IMPLANT
WATER STERILE IRR 3000ML UROMA (IV SOLUTION) ×1 IMPLANT
WATER STERILE IRR 500ML POUR (IV SOLUTION) ×1 IMPLANT

## 2022-02-23 NOTE — Discharge Instructions (Signed)

## 2022-02-23 NOTE — Anesthesia Postprocedure Evaluation (Signed)
Anesthesia Post Note  Patient: Joseph Mcgee  Procedure(s) Performed: CYSTOSCOPY WITH URETHRAL BALLOON DILATATION  Patient location during evaluation: PACU Anesthesia Type: General Level of consciousness: awake Pain management: pain level controlled Vital Signs Assessment: post-procedure vital signs reviewed and stable Respiratory status: spontaneous breathing and nonlabored ventilation Cardiovascular status: stable Anesthetic complications: no  No notable events documented.   Last Vitals:  Vitals:   02/23/22 1200 02/23/22 1227  BP: (!) 109/92 110/77  Pulse: 63 60  Resp: 13 14  Temp: (!) 36.2 C (!) 36.1 C  SpO2: 97% 99%    Last Pain:  Vitals:   02/23/22 1227  TempSrc: Temporal  PainSc: 0-No pain                 VAN STAVEREN,Deloyd Handy

## 2022-02-23 NOTE — Interval H&P Note (Signed)
History and Physical Interval Note:  02/23/2022 10:16 AM  Joseph Mcgee  has presented today for surgery, with the diagnosis of Urethral Stricture.  The various methods of treatment have been discussed with the patient and family. After consideration of risks, benefits and other options for treatment, the patient has consented to  Procedure(s): CYSTOSCOPY WITH URETHRAL BALLOON DILATATION (N/A) as a surgical intervention.  The patient's history has been reviewed, patient examined, no change in status, stable for surgery.  I have reviewed the patient's chart and labs.  Questions were answered to the patient's satisfaction.    RRR CTAB   Hollice Espy
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
## 2022-02-23 NOTE — Op Note (Signed)
Date of procedure: 02/23/22  Preoperative diagnosis:  Bulbar urethral stricture Dysuria Personal history of pelvic radiation  Postoperative diagnosis:  Same as above  Procedure: Balloon dilation of bulbar urethral stricture using Optilume balloon Cystoscopy  Surgeon: Hollice Espy, MD  Anesthesia: General  Complications: None  Intraoperative findings: Approximately 8 French bulbar urethral stricture, 1.5 cm and approximate length.  Able to dilate stricture using the beak of the scope.  Final dilation with Optilume balloon.  Bladder unremarkable with some very subtle vascular changes consistent with previous pelvic radiation.  No papillary tumors or concern for underlying malignancy.  EBL: Minimal  Drains: 72 French catheter converted to council tip  Indication: Joseph Mcgee is a 67 y.o. patient with with a personal history of anal cancer status post pelvic radiation with chronic dysuria.  He was noted to have a bulbar urethral stricture in the office, cystoscopy was not possible..  After reviewing the management options for treatment, he elected to proceed with the above surgical procedure(s). We have discussed the potential benefits and risks of the procedure, side effects of the proposed treatment, the likelihood of the patient achieving the goals of the procedure, and any potential problems that might occur during the procedure or recuperation. Informed consent has been obtained.  Description of procedure:  The patient was taken to the operating room and general anesthesia was induced.  The patient was placed in the dorsal lithotomy position, prepped and draped in the usual sterile fashion, and preoperative antibiotics were administered. A preoperative time-out was performed.   At 1 [place] the scope was advanced to the level of the bulbar urethra where a stricture was encountered.  This was approximately 8 [place] and did not appear particularly dense in diameter.  I advanced a  safety wire through this easily.  I was able to navigate the strictured area with the beak of my scope which was no greater than about 1.5 cm into the prostatic urethra and ultimately into the lumen of the bladder.  The bladder was carefully inspected and noted to be fairly normal in appearance, smooth-walled with a few ectatic spiderlike vessels consistent with previous radiation but no papillary tumors, erythema, or any other concerning for malignancy.  The scope was then removed leaving the safety wire in place.  I advanced an Optilume balloon, 30 French in diameter 50 mm in length into the bulbar urethra and this was inflated to 10 cm of water and left in place for a total of 5 minutes.  At the end of 5 minutes the balloon was deflated.  An 57 French catheter was converted into a council tip using hollow needle through the tip and advanced over the sensor wire to the level of the bladder.  The wire was then removed and there was clear yellow urine return.  The balloon was inflated with 10 cc of sterile water.  The catheter bag was attached and the patient was cleaned and dried, repositioned in the supine position, reversed of anesthesia, and taken to the PACU in stable condition.  Plan: Catheter will be removed on Monday.  Will follow-up thereafter in 4 to 6 weeks for IPSS/PVR.  Hollice Espy, M.D.

## 2022-02-23 NOTE — Transfer of Care (Signed)
Immediate Anesthesia Transfer of Care Note  Patient: Joseph Mcgee  Procedure(s) Performed: CYSTOSCOPY WITH URETHRAL BALLOON DILATATION  Patient Location: PACU  Anesthesia Type:MAC  Level of Consciousness: awake, alert , oriented, and drowsy  Airway & Oxygen Therapy: Patient Spontanous Breathing and Patient connected to face mask oxygen  Post-op Assessment: Report given to RN and Post -op Vital signs reviewed and stable  Post vital signs: Reviewed and stable  Last Vitals:  Vitals Value Taken Time  BP 109/92 02/23/22 1200  Temp 36.2 C 02/23/22 1200  Pulse 51 02/23/22 1203  Resp 13 02/23/22 1203  SpO2 96 % 02/23/22 1203  Vitals shown include unvalidated device data.  Last Pain:  Vitals:   02/23/22 1200  TempSrc:   PainSc: 0-No pain         Complications: No notable events documented.

## 2022-02-24 ENCOUNTER — Encounter: Payer: Self-pay | Admitting: Urology

## 2022-02-27 ENCOUNTER — Encounter: Payer: Self-pay | Admitting: Emergency Medicine

## 2022-02-27 ENCOUNTER — Emergency Department
Admission: EM | Admit: 2022-02-27 | Discharge: 2022-02-28 | Disposition: A | Discharge status: Home / Self Care | Payer: Medicare Other | Attending: Emergency Medicine | Admitting: Emergency Medicine

## 2022-02-27 ENCOUNTER — Inpatient Hospital Stay: Payer: Medicare Other | Attending: Oncology

## 2022-02-27 ENCOUNTER — Ambulatory Visit: Payer: Medicare Other | Admitting: Physician Assistant

## 2022-02-27 DIAGNOSIS — Z452 Encounter for adjustment and management of vascular access device: Secondary | ICD-10-CM | POA: Diagnosis present

## 2022-02-27 DIAGNOSIS — C20 Malignant neoplasm of rectum: Secondary | ICD-10-CM | POA: Diagnosis not present

## 2022-02-27 DIAGNOSIS — R338 Other retention of urine: Secondary | ICD-10-CM

## 2022-02-27 DIAGNOSIS — R339 Retention of urine, unspecified: Secondary | ICD-10-CM | POA: Diagnosis not present

## 2022-02-27 DIAGNOSIS — Z95828 Presence of other vascular implants and grafts: Secondary | ICD-10-CM

## 2022-02-27 DIAGNOSIS — N35011 Post-traumatic bulbous urethral stricture: Secondary | ICD-10-CM

## 2022-02-27 MED ORDER — SODIUM CHLORIDE 0.9% FLUSH
10.0000 mL | Freq: Once | INTRAVENOUS | Status: AC
  Administered 2022-02-27: 10 mL via INTRAVENOUS
  Filled 2022-02-27: qty 10

## 2022-02-27 MED ORDER — HEPARIN SOD (PORK) LOCK FLUSH 100 UNIT/ML IV SOLN
500.0000 [IU] | Freq: Once | INTRAVENOUS | Status: AC
  Administered 2022-02-27: 500 [IU] via INTRAVENOUS
  Filled 2022-02-27: qty 5

## 2022-02-27 NOTE — Patient Instructions (Signed)
[facility] AT [place]  Discharge Instructions: Thank you for choosing [facility] [facility] to provide your oncology and hematology care.  If you have a lab appointment with the [facility], please go directly to the [place] and check in at the registration area.  Wear comfortable clothing and clothing appropriate for easy access to any Portacath or PICC line.   We strive to give you quality time with your provider. You may need to reschedule your appointment if you arrive late (15 or more minutes).  Arriving late affects you and other patients whose appointments are after yours.  Also, if you miss three or more appointments without notifying the office, you may be dismissed from the clinic at the provider's discretion.      For prescription refill requests, have your pharmacy contact our office and allow 72 hours for refills to be completed.    Today you received the following chemotherapy and/or immunotherapy agents PORT FLUSH      To help prevent nausea and vomiting after your treatment, we encourage you to take your nausea medication as directed.  BELOW ARE SYMPTOMS THAT SHOULD BE REPORTED IMMEDIATELY: *FEVER GREATER THAN 100.4 F (38 C) OR HIGHER *CHILLS OR SWEATING *NAUSEA AND VOMITING THAT IS NOT CONTROLLED WITH YOUR NAUSEA MEDICATION *UNUSUAL SHORTNESS OF BREATH *UNUSUAL BRUISING OR BLEEDING *URINARY PROBLEMS (pain or burning when urinating, or frequent urination) *BOWEL PROBLEMS (unusual diarrhea, constipation, pain near the anus) TENDERNESS IN MOUTH AND THROAT WITH OR WITHOUT PRESENCE OF ULCERS (sore throat, sores in mouth, or a toothache) UNUSUAL RASH, SWELLING OR PAIN  UNUSUAL VAGINAL DISCHARGE OR ITCHING   Items with * indicate a potential emergency and should be followed up as soon as possible or go to the Emergency Department if any problems should occur.  Please show the CHEMOTHERAPY ALERT CARD or IMMUNOTHERAPY ALERT CARD at check-in to  the Emergency Department and triage nurse.  Should you have questions after your visit or need to cancel or reschedule your appointment, please contact [facility]CER [place] AT [place]  [phone] and follow the prompts.  Office hours are 8:00 a.m. to 4:30 p.m. Monday - Friday. Please note that voicemails left after 4:00 p.m. may not be returned until the following business day.  We are closed weekends and major holidays. You have access to a nurse at all times for urgent questions. Please call the main number to the clinic [phone] and follow the prompts.  For any non-urgent questions, you may also contact your provider using MyChart. We now offer e-Visits for anyone 2 and older to request care online for non-urgent symptoms. For details visit [email or web address].   Also download the MyChart app! Go to the app store, search "MyChart", open the app, select [place], and log in with your MyChart username and password.

## 2022-02-27 NOTE — ED Triage Notes (Signed)
Pt presents via POV with complaints of urinary retention following the removal of a foley catheter today. Cath was placed 5 days ago to help with the expansion of his urethra. Pt has had 2-3 incidents of voiding with straining with bladder pressure and blood tinged urine. Denies fevers, N/V/D, CP or SOB.

## 2022-02-27 NOTE — ED Provider Notes (Signed)
[facility] Provider Note    Event Date/Time   First MD Initiated Contact with Patient 02/27/22 2347     (approximate)   History   Urinary Retention   HPI  Joseph Mcgee is a 67 y.o. male who presents to the ED for evaluation of Urinary Retention   I reviewed urology clinic visit from earlier today where his Foley catheter was removed.  This was a postprocedural catheter that was placed in the OR after cystoscopy with urethral balloon dilation done by urology on 1/4.  Patient reports achieving 2 dribbling small volume voids after this catheter was removed, and increasing sensation of inability to void.  He comes to the ED with increasing pain, discomfort and inability to pass urine.  No fevers or other concerns.   Physical Exam   Triage Vital Signs: ED Triage Vitals  Enc Vitals Group     BP 02/27/22 2311 (!) 146/81     Pulse Rate 02/27/22 2311 88     Resp 02/27/22 2311 18     Temp 02/27/22 2311 98.1 F (36.7 C)     Temp Source 02/27/22 2311 Oral     SpO2 02/27/22 2311 99 %     Weight 02/27/22 2310 201 lb (91.2 kg)     Height 02/27/22 2310 '5\' 9"'$  (1.753 m)     Head Circumference --      Peak Flow --      Pain Score 02/27/22 2310 9     Pain Loc --      Pain Edu? --      Excl. in [place]? --     Most recent vital signs: Vitals:   02/27/22 2311  BP: (!) 146/81  Pulse: 88  Resp: 18  Temp: 98.1 F (36.7 C)  SpO2: 99%   I evaluate the patient shortly after Foley catheter has been placed by nursing staff per protocols. General: Awake, no distress.  Supine and reports feeling much better after catheter has drained 500 cc plus initial placement.  He is pleasant and conversational.  Looks well. CV:  Good peripheral perfusion.  Resp:  Normal effort.  Abd:  No distention.  Soft and benign.  No penile purulence or bleeding from the urethra. MSK:  No deformity noted.  Neuro:  No focal deficits appreciated. Other:     ED Results / Procedures /  Treatments   Labs (all labs ordered are listed, but only abnormal results are displayed) Labs Reviewed  URINE CULTURE  URINALYSIS, [place] MICROSCOPIC    EKG   RADIOLOGY   Official radiology report(s): No results found.  PROCEDURES and INTERVENTIONS:  Procedures  Medications - No data to display   IMPRESSION / MDM / [place] / ED COURSE  I reviewed the triage vital signs and the nursing notes.  Differential diagnosis includes, but is not limited to, cystitis, sepsis, physical obstruction  Pleasant 67 year old male presents to the ED with acute urinary retention requiring placement of Foley catheter and continued outpatient urologic management.  Doubt underlying infectious etiology considering his lack of symptoms.  Will send his urine for a culture and discharged with a leg bag and this Foley catheter in place that is well draining so he can follow-up with urology again.     FINAL CLINICAL IMPRESSION(S) / ED DIAGNOSES   Final diagnoses:  Urinary retention  Acute urinary retention     Rx / DC Orders   ED Discharge Orders  None        Note:  This document was prepared using Dragon voice recognition software and may include unintentional dictation errors.   Vladimir Crofts, MD 02/27/22 2350

## 2022-02-27 NOTE — Progress Notes (Signed)
Catheter Removal  Patient is present today for a catheter removal.  61m of water was drained from the balloon. A 18FR foley cath was removed from the bladder, no complications were noted. Patient tolerated well.  Performed by: SDebroah Loop PA-C   Additional notes: We discussed normal postoperative findings including dysuria and gross hematuria. He expressed understanding.  Follow up/ Additional notes: Return in about 6 weeks (around 04/10/2022) for Postop f/u with Dr. BErlene Quan

## 2022-02-28 ENCOUNTER — Inpatient Hospital Stay: Payer: Medicare Other

## 2022-02-28 LAB — URINALYSIS, ROUTINE W REFLEX MICROSCOPIC
Bacteria, UA: NONE SEEN
Bilirubin Urine: NEGATIVE
Glucose, UA: >=500 mg/dL — AB
Ketones, ur: NEGATIVE mg/dL
Leukocytes,Ua: NEGATIVE
Nitrite: NEGATIVE
Protein, ur: NEGATIVE mg/dL
RBC / HPF: >50 RBC/hpf — ABNORMAL HIGH (ref 0–5)
Specific Gravity, Urine: 1.022 (ref 1.005–1.030)
Squamous Epithelial / HPF: NONE SEEN /HPF (ref 0–5)
pH: 5 (ref 5.0–8.0)

## 2022-02-28 NOTE — ED Notes (Signed)
E-signature pad unavailable - Pt verbalized understanding of D/C information - no additional concerns at this time.  

## 2022-03-01 LAB — URINE CULTURE: Culture: NO GROWTH
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
## 2022-03-09 ENCOUNTER — Ambulatory Visit: Payer: Medicare Other | Admitting: Urology

## 2022-03-09 ENCOUNTER — Encounter: Payer: Self-pay | Admitting: Urology

## 2022-03-09 VITALS — BP 146/84 | HR 71 | Ht 69.0 in | Wt 199.0 lb

## 2022-03-09 DIAGNOSIS — Z87898 Personal history of other specified conditions: Secondary | ICD-10-CM | POA: Diagnosis not present

## 2022-03-09 DIAGNOSIS — R338 Other retention of urine: Secondary | ICD-10-CM | POA: Diagnosis not present

## 2022-03-09 DIAGNOSIS — N35812 Other urethral bulbous stricture, male: Secondary | ICD-10-CM

## 2022-03-09 LAB — BLADDER SCAN AMB NON-IMAGING

## 2022-03-24 ENCOUNTER — Encounter: Payer: Self-pay | Admitting: Oncology

## 2022-03-24 ENCOUNTER — Inpatient Hospital Stay: Payer: Medicare Other | Admitting: Oncology

## 2022-03-24 ENCOUNTER — Inpatient Hospital Stay: Payer: Medicare Other | Attending: Oncology

## 2022-03-24 VITALS — BP 140/78 | HR 78 | Temp 97.3°F | Resp 16 | Wt 205.3 lb

## 2022-03-24 DIAGNOSIS — Z85048 Personal history of other malignant neoplasm of rectum, rectosigmoid junction, and anus: Secondary | ICD-10-CM | POA: Insufficient documentation

## 2022-03-24 DIAGNOSIS — Z87891 Personal history of nicotine dependence: Secondary | ICD-10-CM | POA: Diagnosis not present

## 2022-03-24 DIAGNOSIS — Z923 Personal history of irradiation: Secondary | ICD-10-CM | POA: Diagnosis not present

## 2022-03-24 DIAGNOSIS — C210 Malignant neoplasm of anus, unspecified: Secondary | ICD-10-CM

## 2022-03-24 DIAGNOSIS — Z9221 Personal history of antineoplastic chemotherapy: Secondary | ICD-10-CM | POA: Diagnosis not present

## 2022-03-24 DIAGNOSIS — N1831 Chronic kidney disease, stage 3a: Secondary | ICD-10-CM

## 2022-03-24 LAB — CBC WITH DIFFERENTIAL/PLATELET
Abs Immature Granulocytes: 0.06 10*3/uL (ref 0.00–0.07)
Basophils Absolute: 0.1 10*3/uL (ref 0.0–0.1)
Basophils Relative: 1 %
Eosinophils Absolute: 0.3 10*3/uL (ref 0.0–0.5)
Eosinophils Relative: 4 %
HCT: 49 % (ref 39.0–52.0)
Hemoglobin: 16.9 g/dL (ref 13.0–17.0)
Immature Granulocytes: 1 %
Lymphocytes Relative: 18 %
Lymphs Abs: 1.3 10*3/uL (ref 0.7–4.0)
MCH: 29.9 pg (ref 26.0–34.0)
MCHC: 34.5 g/dL (ref 30.0–36.0)
MCV: 86.7 fL (ref 80.0–100.0)
Monocytes Absolute: 0.8 10*3/uL (ref 0.1–1.0)
Monocytes Relative: 12 %
Neutro Abs: 4.3 10*3/uL (ref 1.7–7.7)
Neutrophils Relative %: 64 %
Platelets: 243 10*3/uL (ref 150–400)
RBC: 5.65 MIL/uL (ref 4.22–5.81)
RDW: 13.5 % (ref 11.5–15.5)
WBC: 6.8 10*3/uL (ref 4.0–10.5)
nRBC: 0 % (ref 0.0–0.2)

## 2022-03-24 LAB — COMPREHENSIVE METABOLIC PANEL
ALT: 38 U/L (ref 0–44)
AST: 26 U/L (ref 15–41)
Albumin: 4 g/dL (ref 3.5–5.0)
Alkaline Phosphatase: 76 U/L (ref 38–126)
Anion gap: 11 (ref 5–15)
BUN: 28 mg/dL — ABNORMAL HIGH (ref 8–23)
CO2: 21 mmol/L — ABNORMAL LOW (ref 22–32)
Calcium: 8.9 mg/dL (ref 8.9–10.3)
Chloride: 103 mmol/L (ref 98–111)
Creatinine, Ser: 1.35 mg/dL — ABNORMAL HIGH (ref 0.61–1.24)
GFR, Estimated: 58 mL/min — ABNORMAL LOW (ref >60)
Glucose, Bld: 267 mg/dL — ABNORMAL HIGH (ref 70–99)
Potassium: 4 mmol/L (ref 3.5–5.1)
Sodium: 135 mmol/L (ref 135–145)
Total Bilirubin: 0.2 mg/dL — ABNORMAL LOW (ref 0.3–1.2)
Total Protein: 7.2 g/dL (ref 6.5–8.1)

## 2022-03-24 NOTE — Assessment & Plan Note (Addendum)
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 DRE/anoscopy by surgery 8 weeks after concurrent chemotherapy and radiation showed complete response. Recommend patient to continue follow-up with surgery for DRE/anoscopy/inguinal node palpation every 6 months x 2-3 years, Labs are reviewed and discussed with patient.  Continue annual imaging x 3 years. -Aug 2024

## 2022-03-25 NOTE — Assessment & Plan Note (Signed)
Avoid nephrotoxins.  Encourage oral hydration Optimize glycemic control
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
## 2022-04-11 ENCOUNTER — Encounter: Payer: Self-pay | Admitting: Urology

## 2022-04-11 ENCOUNTER — Ambulatory Visit: Payer: Medicare Other | Admitting: Urology

## 2022-04-11 VITALS — BP 131/83 | HR 76 | Ht 69.0 in | Wt 209.0 lb

## 2022-04-11 DIAGNOSIS — R338 Other retention of urine: Secondary | ICD-10-CM

## 2022-04-11 DIAGNOSIS — Z87448 Personal history of other diseases of urinary system: Secondary | ICD-10-CM | POA: Diagnosis not present

## 2022-04-11 DIAGNOSIS — N35812 Other urethral bulbous stricture, male: Secondary | ICD-10-CM

## 2022-04-11 DIAGNOSIS — Z87442 Personal history of urinary calculi: Secondary | ICD-10-CM | POA: Diagnosis not present

## 2022-04-11 LAB — BLADDER SCAN AMB NON-IMAGING: Scan Result: 18
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
## 2022-04-25 ENCOUNTER — Inpatient Hospital Stay: Payer: Medicare Other

## 2022-04-26 ENCOUNTER — Inpatient Hospital Stay: Payer: Medicare Other | Attending: Oncology

## 2022-04-26 DIAGNOSIS — Z452 Encounter for adjustment and management of vascular access device: Secondary | ICD-10-CM | POA: Diagnosis present

## 2022-04-26 DIAGNOSIS — Z85048 Personal history of other malignant neoplasm of rectum, rectosigmoid junction, and anus: Secondary | ICD-10-CM | POA: Diagnosis present

## 2022-04-26 DIAGNOSIS — Z95828 Presence of other vascular implants and grafts: Secondary | ICD-10-CM

## 2022-04-26 MED ORDER — HEPARIN SOD (PORK) LOCK FLUSH 100 UNIT/ML IV SOLN
500.0000 [IU] | Freq: Once | INTRAVENOUS | Status: AC
  Administered 2022-04-26: 500 [IU] via INTRAVENOUS
  Filled 2022-04-26: qty 5

## 2022-04-26 MED ORDER — SODIUM CHLORIDE 0.9% FLUSH
10.0000 mL | Freq: Once | INTRAVENOUS | Status: AC
  Administered 2022-04-26: 10 mL via INTRAVENOUS
  Filled 2022-04-26: qty 10

## 2022-05-19 ENCOUNTER — Inpatient Hospital Stay: Payer: Medicare Other

## 2022-07-12 ENCOUNTER — Inpatient Hospital Stay: Payer: Medicare Other | Attending: Oncology

## 2022-07-12 ENCOUNTER — Ambulatory Visit
Admission: RE | Admit: 2022-07-12 | Discharge: 2022-07-12 | Disposition: A | Discharge status: Home / Self Care | Payer: Medicare Other | Source: Ambulatory Visit | Attending: Radiation Oncology | Admitting: Radiation Oncology

## 2022-07-12 ENCOUNTER — Encounter: Payer: Self-pay | Admitting: Radiation Oncology

## 2022-07-12 VITALS — BP 129/90 | Temp 96.8°F | Resp 16 | Wt 206.5 lb

## 2022-07-12 DIAGNOSIS — Z85048 Personal history of other malignant neoplasm of rectum, rectosigmoid junction, and anus: Secondary | ICD-10-CM | POA: Diagnosis present

## 2022-07-12 DIAGNOSIS — C20 Malignant neoplasm of rectum: Secondary | ICD-10-CM

## 2022-07-12 DIAGNOSIS — Z95828 Presence of other vascular implants and grafts: Secondary | ICD-10-CM

## 2022-07-12 DIAGNOSIS — Z452 Encounter for adjustment and management of vascular access device: Secondary | ICD-10-CM | POA: Insufficient documentation

## 2022-07-12 MED ORDER — SODIUM CHLORIDE 0.9% FLUSH
10.0000 mL | Freq: Once | INTRAVENOUS | Status: AC
  Administered 2022-07-12: 10 mL via INTRAVENOUS
  Filled 2022-07-12: qty 10

## 2022-07-12 MED ORDER — HEPARIN SOD (PORK) LOCK FLUSH 100 UNIT/ML IV SOLN
500.0000 [IU] | Freq: Once | INTRAVENOUS | Status: AC
  Administered 2022-07-12: 500 [IU] via INTRAVENOUS
  Filled 2022-07-12: qty 5

## 2022-07-12 NOTE — Progress Notes (Signed)
Radiation Oncology Follow up Note  Name: Joseph Mcgee   Date:   07/12/2022 MRN:  [id number] DOB: 1955-09-29    This 67 y.o. 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  Seen today in routine follow-up he is doing well occasionally does see some spots of blood most likely from external hemorrhoids.  He does have some rectal urgency no incontinence.  No lower urinary tract symptoms at this time.  He had a CT scan back in August which I have reviewed showing no evidence of disease..  COMPLICATIONS OF TREATMENT: none  FOLLOW UP COMPLIANCE: keeps appointments   PHYSICAL EXAM:  BP (!) 129/90 (BP Location: Left Arm, Patient Position: Sitting)   Temp (!) 96.8 F (36 C) (Tympanic)   Resp 16   Wt 206 lb 8 oz (93.7 kg)   BMI 30.49 kg/m  On rectal exam rectal sphincter tone is good no evidence of mass or nodularity in anal canal or anal region is noted.  No inguinal adenopathy is appreciated.  Well-developed well-nourished patient in NAD. HEENT reveals PERLA, EOMI, discs not visualized.  Oral cavity is clear. No oral mucosal lesions are identified. Neck is clear without evidence of cervical or supraclavicular adenopathy. Lungs are clear to A&P. Cardiac examination is essentially unremarkable with regular rate and rhythm without murmur rub or thrill. Abdomen is benign with no organomegaly or masses noted. Motor sensory and DTR levels are equal and symmetric in the upper and lower extremities. Cranial nerves II through XII are grossly intact. Proprioception is intact. No peripheral adenopathy or edema is identified. No motor or sensory levels are noted. Crude  visual fields are within normal range.  RADIOLOGY RESULTS: CT scans reviewed compatible with above-stated findings  PLAN: Present time patient is doing well 1 year out with no evidence evidence of disease.  I have asked to see him back in 1 year for follow-up.  He has a CT scan this August which I will review when it becomes available.  Patient knows to call with any concerns.  I would like to take this opportunity to thank you for allowing me to participate in the care of your patient.Carmina Miller, MD

## 2022-07-14 ENCOUNTER — Inpatient Hospital Stay: Payer: Medicare Other

## 2022-09-08 ENCOUNTER — Inpatient Hospital Stay: Payer: Medicare Other | Attending: Oncology

## 2022-09-25 ENCOUNTER — Ambulatory Visit
Admission: RE | Admit: 2022-09-25 | Discharge: 2022-09-25 | Disposition: A | Discharge status: Home / Self Care | Payer: Medicare Other | Source: Ambulatory Visit | Attending: Oncology | Admitting: Oncology

## 2022-09-25 DIAGNOSIS — C210 Malignant neoplasm of anus, unspecified: Secondary | ICD-10-CM | POA: Insufficient documentation

## 2022-09-29 ENCOUNTER — Encounter: Payer: Self-pay | Admitting: Oncology

## 2022-09-29 ENCOUNTER — Inpatient Hospital Stay: Payer: Medicare Other | Attending: Oncology

## 2022-09-29 ENCOUNTER — Inpatient Hospital Stay: Payer: Medicare Other | Admitting: Oncology

## 2022-09-29 VITALS — BP 129/85 | HR 57 | Temp 96.1°F | Resp 18 | Wt 207.2 lb

## 2022-09-29 DIAGNOSIS — Z95828 Presence of other vascular implants and grafts: Secondary | ICD-10-CM

## 2022-09-29 DIAGNOSIS — N1831 Chronic kidney disease, stage 3a: Secondary | ICD-10-CM | POA: Diagnosis not present

## 2022-09-29 DIAGNOSIS — C210 Malignant neoplasm of anus, unspecified: Secondary | ICD-10-CM | POA: Diagnosis not present

## 2022-09-29 DIAGNOSIS — Z08 Encounter for follow-up examination after completed treatment for malignant neoplasm: Secondary | ICD-10-CM | POA: Insufficient documentation

## 2022-09-29 DIAGNOSIS — Z85048 Personal history of other malignant neoplasm of rectum, rectosigmoid junction, and anus: Secondary | ICD-10-CM | POA: Diagnosis present

## 2022-09-29 DIAGNOSIS — D751 Secondary polycythemia: Secondary | ICD-10-CM | POA: Diagnosis not present

## 2022-09-29 DIAGNOSIS — Z87891 Personal history of nicotine dependence: Secondary | ICD-10-CM | POA: Insufficient documentation

## 2022-09-29 LAB — CBC WITH DIFFERENTIAL/PLATELET
Abs Immature Granulocytes: 0.03 10*3/uL (ref 0.00–0.07)
Basophils Absolute: 0.1 10*3/uL (ref 0.0–0.1)
Basophils Relative: 1 %
Eosinophils Absolute: 0.3 10*3/uL (ref 0.0–0.5)
Eosinophils Relative: 4 %
HCT: 53.7 % — ABNORMAL HIGH (ref 39.0–52.0)
Hemoglobin: 18.1 g/dL — ABNORMAL HIGH (ref 13.0–17.0)
Immature Granulocytes: 1 %
Lymphocytes Relative: 24 %
Lymphs Abs: 1.6 10*3/uL (ref 0.7–4.0)
MCH: 29.8 pg (ref 26.0–34.0)
MCHC: 33.7 g/dL (ref 30.0–36.0)
MCV: 88.3 fL (ref 80.0–100.0)
Monocytes Absolute: 0.8 10*3/uL (ref 0.1–1.0)
Monocytes Relative: 13 %
Neutro Abs: 3.7 10*3/uL (ref 1.7–7.7)
Neutrophils Relative %: 57 %
Platelets: 247 10*3/uL (ref 150–400)
RBC: 6.08 MIL/uL — ABNORMAL HIGH (ref 4.22–5.81)
RDW: 13.8 % (ref 11.5–15.5)
WBC: 6.5 10*3/uL (ref 4.0–10.5)
nRBC: 0 % (ref 0.0–0.2)

## 2022-09-29 LAB — COMPREHENSIVE METABOLIC PANEL
ALT: 37 U/L (ref 0–44)
AST: 25 U/L (ref 15–41)
Albumin: 4.3 g/dL (ref 3.5–5.0)
Alkaline Phosphatase: 72 U/L (ref 38–126)
Anion gap: 9 (ref 5–15)
BUN: 25 mg/dL — ABNORMAL HIGH (ref 8–23)
CO2: 22 mmol/L (ref 22–32)
Calcium: 9.2 mg/dL (ref 8.9–10.3)
Chloride: 104 mmol/L (ref 98–111)
Creatinine, Ser: 1.34 mg/dL — ABNORMAL HIGH (ref 0.61–1.24)
GFR, Estimated: 58 mL/min — ABNORMAL LOW (ref >60)
Glucose, Bld: 152 mg/dL — ABNORMAL HIGH (ref 70–99)
Potassium: 3.9 mmol/L (ref 3.5–5.1)
Sodium: 135 mmol/L (ref 135–145)
Total Bilirubin: 0.8 mg/dL (ref 0.3–1.2)
Total Protein: 7.6 g/dL (ref 6.5–8.1)

## 2022-09-29 NOTE — Assessment & Plan Note (Signed)
Avoid nephrotoxins.  Encourage oral hydration Optimize glycemic control

## 2022-09-29 NOTE — Assessment & Plan Note (Addendum)
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 DRE/anoscopy by surgery 8 weeks after concurrent chemotherapy and radiation showed complete response. Recommend patient to continue follow-up with surgery for DRE/anoscopy/inguinal node palpation every 6 months x 2-3 years, Labs are reviewed and discussed with patient.  CT scan shows no evidence of cancer recurrence or progression.  Stable retroperitoneal lymphadenopathy. Continue annual imaging x 3 years. -Next due Aug 2025

## 2022-09-29 NOTE — Assessment & Plan Note (Signed)
Will repeat CBC in 2 weeks.  If persistently elevated, recommend patient to obtain sleep study
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
## 2022-09-29 NOTE — Assessment & Plan Note (Signed)
Okay to remove Mediport.  Patient will contact Dr. Geoffery Lyons office for Mediport removal.

## 2022-10-02 ENCOUNTER — Telehealth: Payer: Self-pay

## 2022-10-02 NOTE — Telephone Encounter (Signed)
Port removal order has been faxed to Dr. Geoffery Lyons office.

## 2022-10-02 NOTE — Telephone Encounter (Signed)
Family member called asking if you would send order to Dr Tonna Boehringer to have port removed. She said it was discussed at last appointment and Dr Cathie Hoops said it was okay to have it removed. Call back # [phone].

## 2022-10-13 ENCOUNTER — Inpatient Hospital Stay: Payer: Medicare Other

## 2022-10-13 DIAGNOSIS — Z08 Encounter for follow-up examination after completed treatment for malignant neoplasm: Secondary | ICD-10-CM | POA: Diagnosis not present

## 2022-10-13 DIAGNOSIS — C210 Malignant neoplasm of anus, unspecified: Secondary | ICD-10-CM

## 2022-10-13 LAB — CBC WITH DIFFERENTIAL (CANCER CENTER ONLY)
Abs Immature Granulocytes: 0.04 10*3/uL (ref 0.00–0.07)
Basophils Absolute: 0.1 10*3/uL (ref 0.0–0.1)
Basophils Relative: 1 %
Eosinophils Absolute: 0.3 10*3/uL (ref 0.0–0.5)
Eosinophils Relative: 5 %
HCT: 49.2 % (ref 39.0–52.0)
Hemoglobin: 16.5 g/dL (ref 13.0–17.0)
Immature Granulocytes: 1 %
Lymphocytes Relative: 20 %
Lymphs Abs: 1.3 10*3/uL (ref 0.7–4.0)
MCH: 30 pg (ref 26.0–34.0)
MCHC: 33.5 g/dL (ref 30.0–36.0)
MCV: 89.5 fL (ref 80.0–100.0)
Monocytes Absolute: 0.7 10*3/uL (ref 0.1–1.0)
Monocytes Relative: 12 %
Neutro Abs: 3.9 10*3/uL (ref 1.7–7.7)
Neutrophils Relative %: 61 %
Platelet Count: 215 10*3/uL (ref 150–400)
RBC: 5.5 MIL/uL (ref 4.22–5.81)
RDW: 13.8 % (ref 11.5–15.5)
WBC Count: 6.4 10*3/uL (ref 4.0–10.5)
nRBC: 0 % (ref 0.0–0.2)

## 2022-10-17 ENCOUNTER — Ambulatory Visit: Payer: Medicare Other

## 2022-11-03 ENCOUNTER — Inpatient Hospital Stay: Payer: Medicare Other

## 2023-04-05 NOTE — Assessment & Plan Note (Signed)
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 DRE/anoscopy by surgery 8 weeks after concurrent chemotherapy and radiation showed complete response. Recommend patient to continue follow-up with surgery for DRE/anoscopy/inguinal node palpation every 6 months x 2-3 years, Labs are reviewed and discussed with patient.  CT scan shows no evidence of cancer recurrence or progression.  Stable retroperitoneal lymphadenopathy. Continue annual imaging x 3 years. -Next due Aug 2025

## 2023-04-06 ENCOUNTER — Inpatient Hospital Stay: Payer: Medicare (Managed Care) | Admitting: Oncology

## 2023-04-06 ENCOUNTER — Inpatient Hospital Stay: Payer: Medicare (Managed Care) | Attending: Oncology

## 2023-04-06 ENCOUNTER — Encounter: Payer: Self-pay | Admitting: Oncology

## 2023-04-06 VITALS — BP 130/87 | HR 82 | Temp 97.6°F | Resp 18 | Wt 211.9 lb

## 2023-04-06 DIAGNOSIS — C210 Malignant neoplasm of anus, unspecified: Secondary | ICD-10-CM | POA: Insufficient documentation

## 2023-04-06 DIAGNOSIS — D751 Secondary polycythemia: Secondary | ICD-10-CM | POA: Diagnosis not present

## 2023-04-06 DIAGNOSIS — N1831 Chronic kidney disease, stage 3a: Secondary | ICD-10-CM

## 2023-04-06 DIAGNOSIS — Z87891 Personal history of nicotine dependence: Secondary | ICD-10-CM | POA: Insufficient documentation

## 2023-04-06 LAB — CMP (CANCER CENTER ONLY)
ALT: 44 U/L (ref 0–44)
AST: 27 U/L (ref 15–41)
Albumin: 3.8 g/dL (ref 3.5–5.0)
Alkaline Phosphatase: 69 U/L (ref 38–126)
Anion gap: 12 (ref 5–15)
BUN: 28 mg/dL — ABNORMAL HIGH (ref 8–23)
CO2: 22 mmol/L (ref 22–32)
Calcium: 8.9 mg/dL (ref 8.9–10.3)
Chloride: 101 mmol/L (ref 98–111)
Creatinine: 1.41 mg/dL — ABNORMAL HIGH (ref 0.61–1.24)
GFR, Estimated: 55 mL/min — ABNORMAL LOW (ref >60)
Glucose, Bld: 257 mg/dL — ABNORMAL HIGH (ref 70–99)
Potassium: 3.7 mmol/L (ref 3.5–5.1)
Sodium: 135 mmol/L (ref 135–145)
Total Bilirubin: 0.7 mg/dL (ref 0.0–1.2)
Total Protein: 7.2 g/dL (ref 6.5–8.1)

## 2023-04-06 LAB — CBC WITH DIFFERENTIAL (CANCER CENTER ONLY)
Abs Immature Granulocytes: 0.06 10*3/uL (ref 0.00–0.07)
Basophils Absolute: 0.1 10*3/uL (ref 0.0–0.1)
Basophils Relative: 1 %
Eosinophils Absolute: 0.2 10*3/uL (ref 0.0–0.5)
Eosinophils Relative: 3 %
HCT: 51.8 % (ref 39.0–52.0)
Hemoglobin: 17.4 g/dL — ABNORMAL HIGH (ref 13.0–17.0)
Immature Granulocytes: 1 %
Lymphocytes Relative: 20 %
Lymphs Abs: 1.4 10*3/uL (ref 0.7–4.0)
MCH: 30.1 pg (ref 26.0–34.0)
MCHC: 33.6 g/dL (ref 30.0–36.0)
MCV: 89.5 fL (ref 80.0–100.0)
Monocytes Absolute: 0.7 10*3/uL (ref 0.1–1.0)
Monocytes Relative: 11 %
Neutro Abs: 4.5 10*3/uL (ref 1.7–7.7)
Neutrophils Relative %: 64 %
Platelet Count: 261 10*3/uL (ref 150–400)
RBC: 5.79 MIL/uL (ref 4.22–5.81)
RDW: 13.8 % (ref 11.5–15.5)
WBC Count: 7.1 10*3/uL (ref 4.0–10.5)
nRBC: 0 % (ref 0.0–0.2)

## 2023-04-06 NOTE — Assessment & Plan Note (Signed)
persistently elevated, recommend patient to obtain sleep study Advise weight loss

## 2023-04-06 NOTE — Progress Notes (Signed)
Pt here for follow up. Pt reports he has some rawness due to having frequent bowel movements. No loose stools

## 2023-04-06 NOTE — Assessment & Plan Note (Signed)
Avoid nephrotoxins.  Encourage oral hydration Optimize glycemic control
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
## 2023-04-10 ENCOUNTER — Encounter: Payer: Self-pay | Admitting: Urology

## 2023-04-10 ENCOUNTER — Ambulatory Visit: Payer: Self-pay | Admitting: Urology

## 2023-04-10 VITALS — BP 138/82 | HR 111 | Ht 69.0 in | Wt 211.0 lb

## 2023-04-10 DIAGNOSIS — N200 Calculus of kidney: Secondary | ICD-10-CM

## 2023-04-10 DIAGNOSIS — N35812 Other urethral bulbous stricture, male: Secondary | ICD-10-CM

## 2023-04-10 LAB — BLADDER SCAN AMB NON-IMAGING

## 2023-04-10 NOTE — Patient Instructions (Signed)

## 2023-05-03 ENCOUNTER — Ambulatory Visit: Payer: Medicare (Managed Care) | Admitting: Urology

## 2023-05-03 VITALS — BP 153/79 | HR 87 | Ht 69.0 in | Wt 210.1 lb

## 2023-05-03 DIAGNOSIS — N35812 Other urethral bulbous stricture, male: Secondary | ICD-10-CM | POA: Diagnosis not present

## 2023-05-03 LAB — URINALYSIS, COMPLETE
Bilirubin, UA: NEGATIVE
Ketones, UA: NEGATIVE
Leukocytes,UA: NEGATIVE
Nitrite, UA: NEGATIVE
Protein,UA: NEGATIVE
RBC, UA: NEGATIVE
Specific Gravity, UA: 1.02 (ref 1.005–1.030)
Urobilinogen, Ur: 0.2 mg/dL (ref 0.2–1.0)
pH, UA: 5.5 (ref 5.0–7.5)

## 2023-05-03 LAB — MICROSCOPIC EXAMINATION: Bacteria, UA: NONE SEEN

## 2023-05-03 NOTE — Progress Notes (Signed)
   05/03/23  CC:  Chief Complaint  Patient presents with   Cysto    HPI: 68 year old male with a personal history of 8 French bulbar urethral stricture status post Optilume balloon dilation on 1/24 who returns today with recurrent symptoms.  Notably, he has a personal history of diabetes and pelvic radiation.  Blood pressure (!) 153/79, pulse 87, height 5\' 9"  (1.753 m), weight 210 lb 2 oz (95.3 kg). NED. A&Ox3.   No respiratory distress   Abd soft, NT, ND Normal phallus with bilateral descended testicles  Cystoscopy Procedure Note  Patient identification was confirmed, informed consent was obtained, and patient was prepped using Betadine solution.  Lidocaine jelly was administered per urethral meatus.     Pre-Procedure: - Inspection reveals a normal caliber ureteral meatus.  Procedure: The flexible cystoscope was introduced without difficulty -There are a few concentric strictures in the pendulous urethra which were traversable with the 16 French flexible cystoscope.  Upon reaching the bulbar urethra, a similar 8 French bulbar urethral stricture was identified which was not traversable.  Post-Procedure: - Patient tolerated the procedure well  Assessment/ Plan:  1. Other stricture of bulbous urethra in male (Primary) Recurrent bulbar urethral stricture  Risk factors include personal history of pelvic radiation as well as previous trauma/bladder versus urethral surgery as a child.  We had a lengthy conversation today along with his wife regarding treatment options.  We could return to the operating room for another balloon urethral dilation however the success rate would likely be similar.  Alternatively, could consider referral for more definitive treatment such as urethroplasty.  He like to know more about this and we arranged for a referral to reconstructive urology at [facility]. - Urinalysis, Complete - Ambulatory referral to Urology   Vanna Scotland, MD

## 2023-07-05 IMAGING — CR DG ABDOMEN 1V
1 series · 2 of 2 positions shown · non-contrast
Comparison: PET CT 1 week ago 03/30/2021

CLINICAL DATA: Kidney stone.

EXAM:
ABDOMEN - 1 VIEW

[Series 1: dg abd 1 view · 0.14mm/px · 2 of 2 slices shown]
[im 1/2]
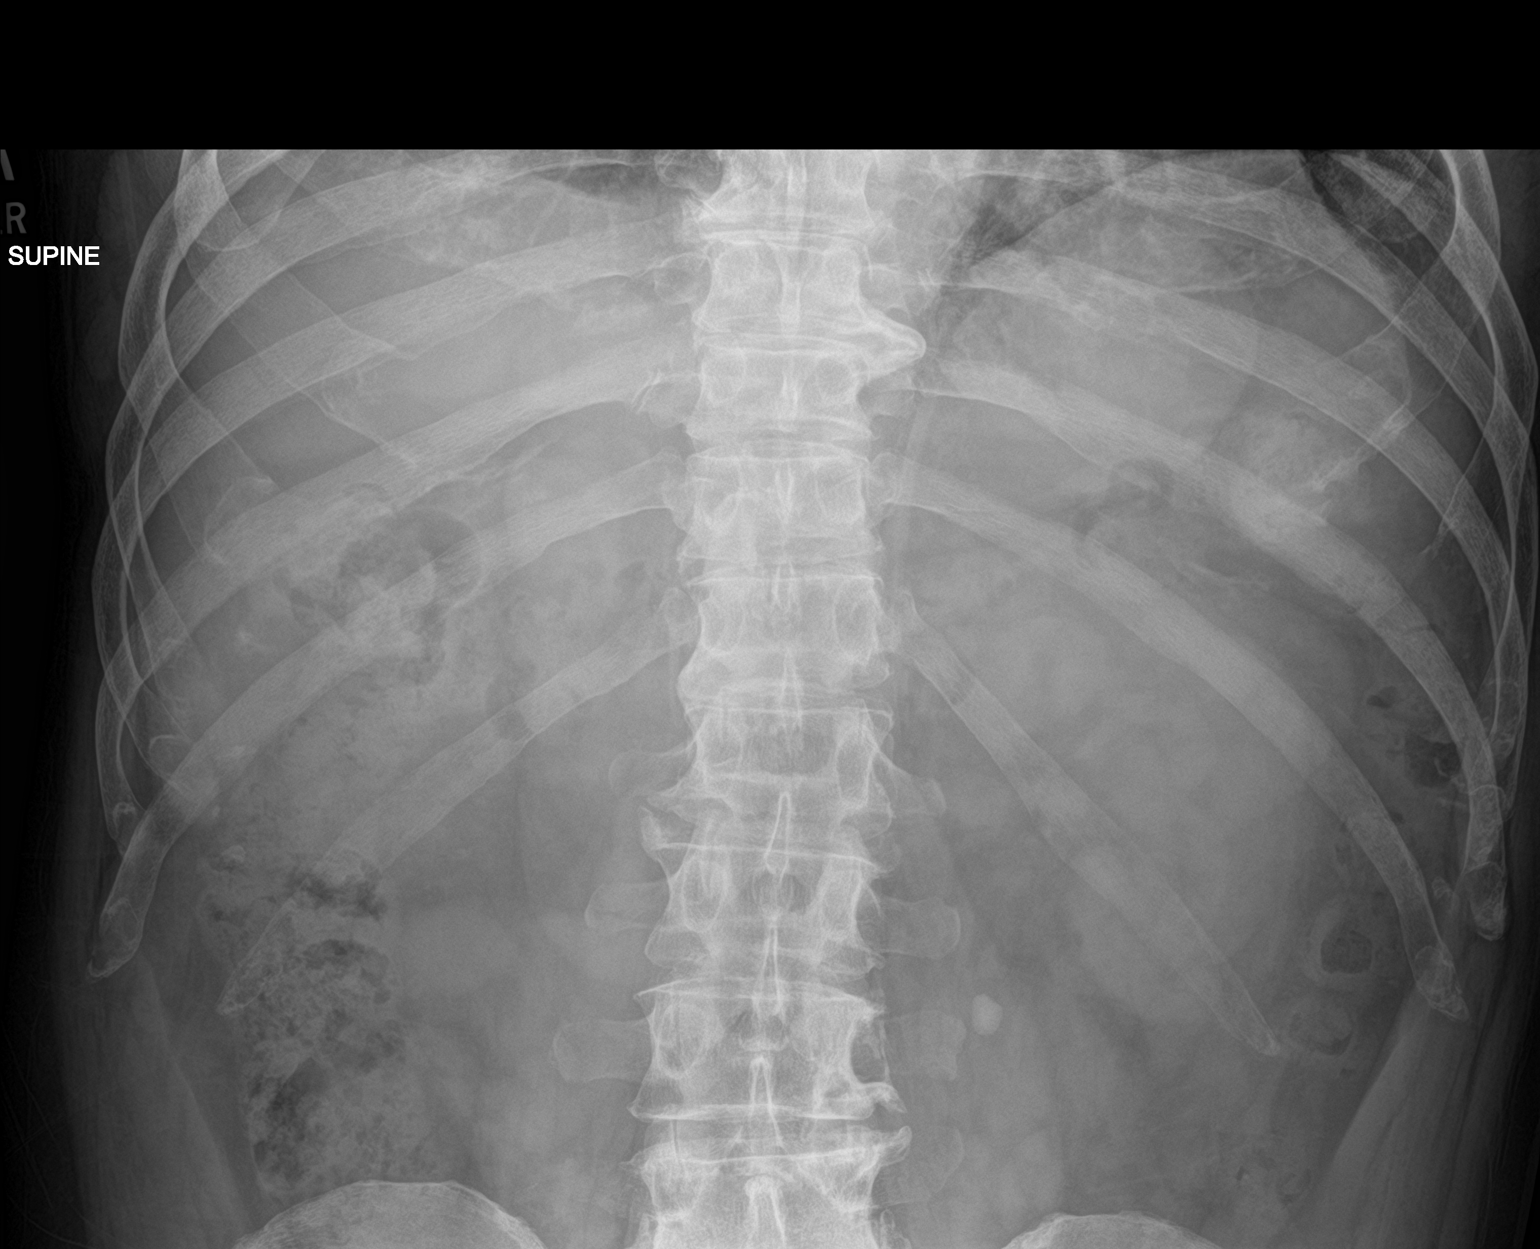
[im 2/2]
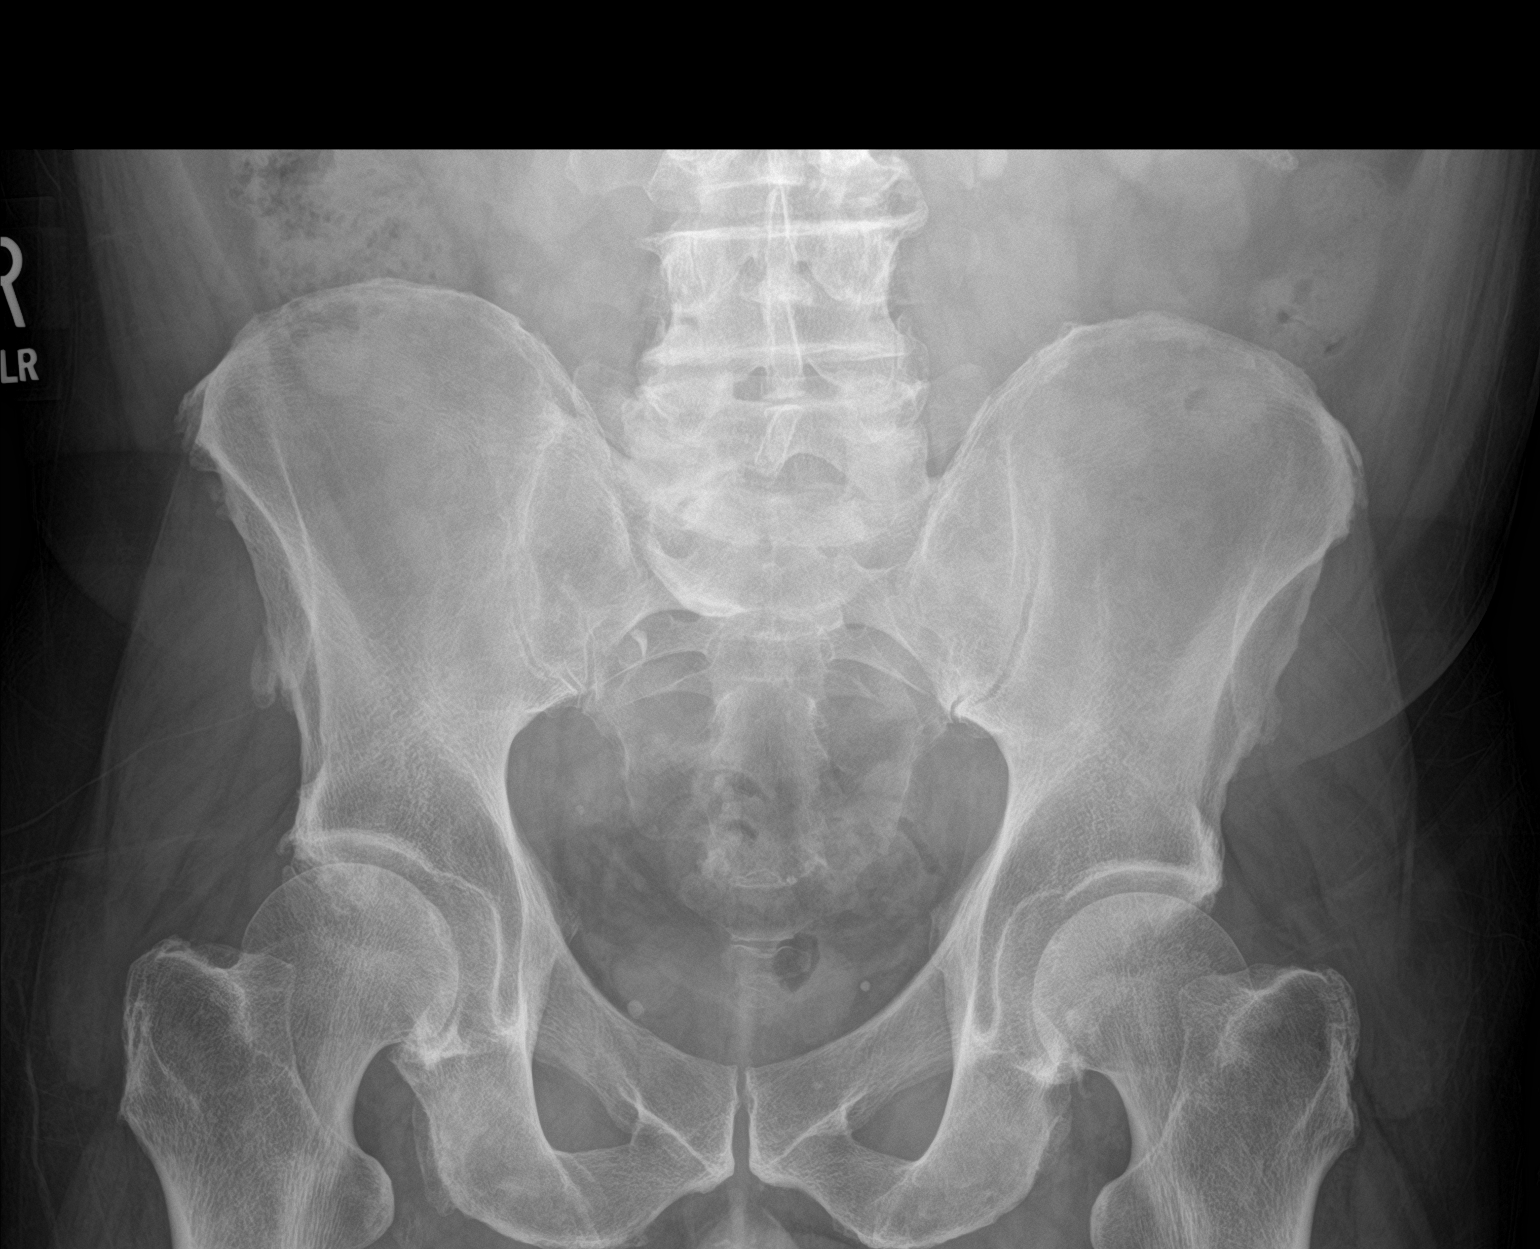

[2 of 2 positions shown; findings below may reference images not displayed]

FINDINGS: 11 mm calcifications to the left of L2-L3 consistent with proximal
ureteral stone. This is unchanged in position from prior PET CT. The
punctate right intrarenal calculus on CT is not definitively seen by
radiograph. Pelvic calcifications typical of phleboliths are
unchanged. Normal bowel gas pattern with small volume of colonic
stool.
IMPRESSION: 1. An 11 mm proximal left ureteral stone is unchanged in position
from prior PET CT.
2. Punctate right intrarenal calculus on CT is not definitively seen
by radiograph.

## 2023-07-18 ENCOUNTER — Ambulatory Visit: Payer: Medicare Other | Admitting: Radiation Oncology

## 2023-07-19 IMAGING — DX DG CHEST 1V PORT
1 series · 1 of 1 positions shown · non-contrast
Comparison: PET-CT dated March 30, 2021.

CLINICAL DATA: Port placement.

EXAM:
PORTABLE CHEST 1 VIEW

[chest ap]
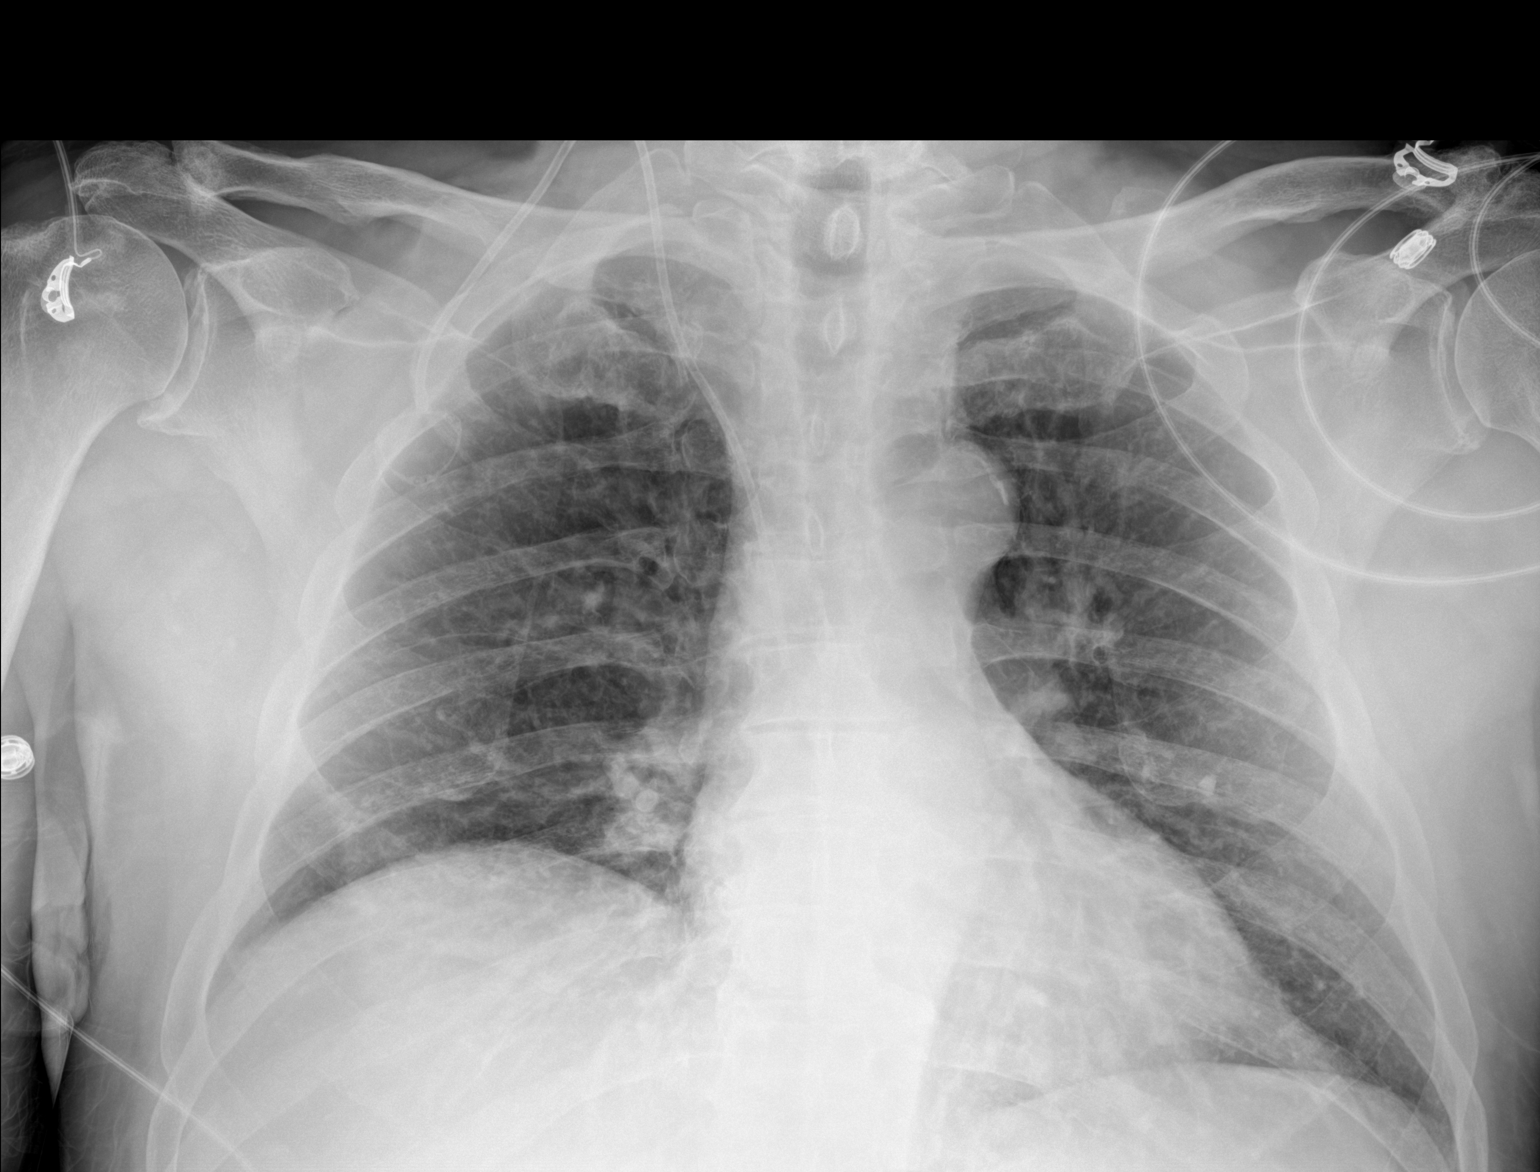

[1 of 1 positions shown; findings below may reference images not displayed]

FINDINGS: New right chest wall port catheter with tip in the distal SVC. The
heart size and mediastinal contours are within normal limits. Normal
pulmonary vascularity. No focal consolidation, pleural effusion, or
pneumothorax. No acute osseous abnormality.
IMPRESSION: 1. New right chest wall port catheter with tip in the distal SVC. No
complicating feature.

## 2023-08-09 IMAGING — CR DG ABDOMEN 1V
1 series · 2 of 2 positions shown · non-contrast
Comparison: 04/14/2021

CLINICAL DATA: Left sided Nephrolithiasis; first time having kidney
stones; Had lithotripsy 2 weeks ago; some hematuria but pt thinks
that may be from his radiation treatment for anal cancer

EXAM:
ABDOMEN - 1 VIEW

[Series 1: dg abd 1 view · 0.14mm/px · 2 of 2 slices shown]
[im 1/2]
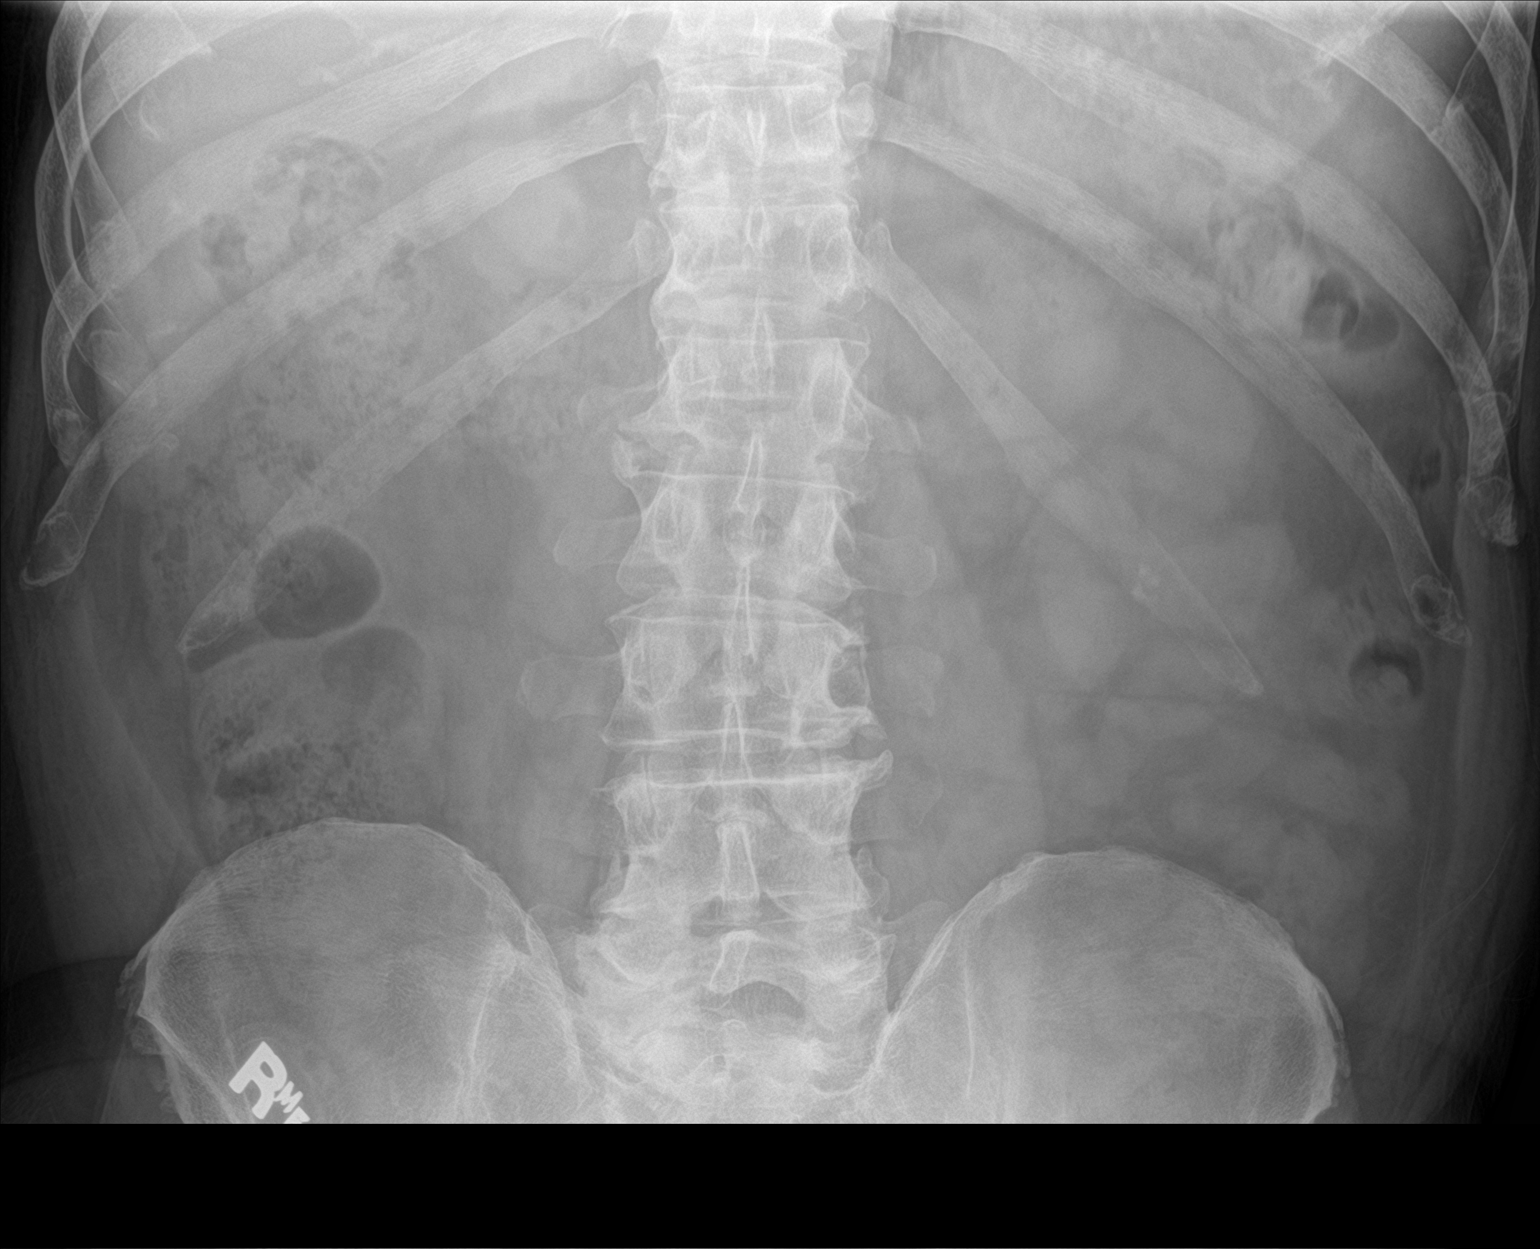
[im 2/2]
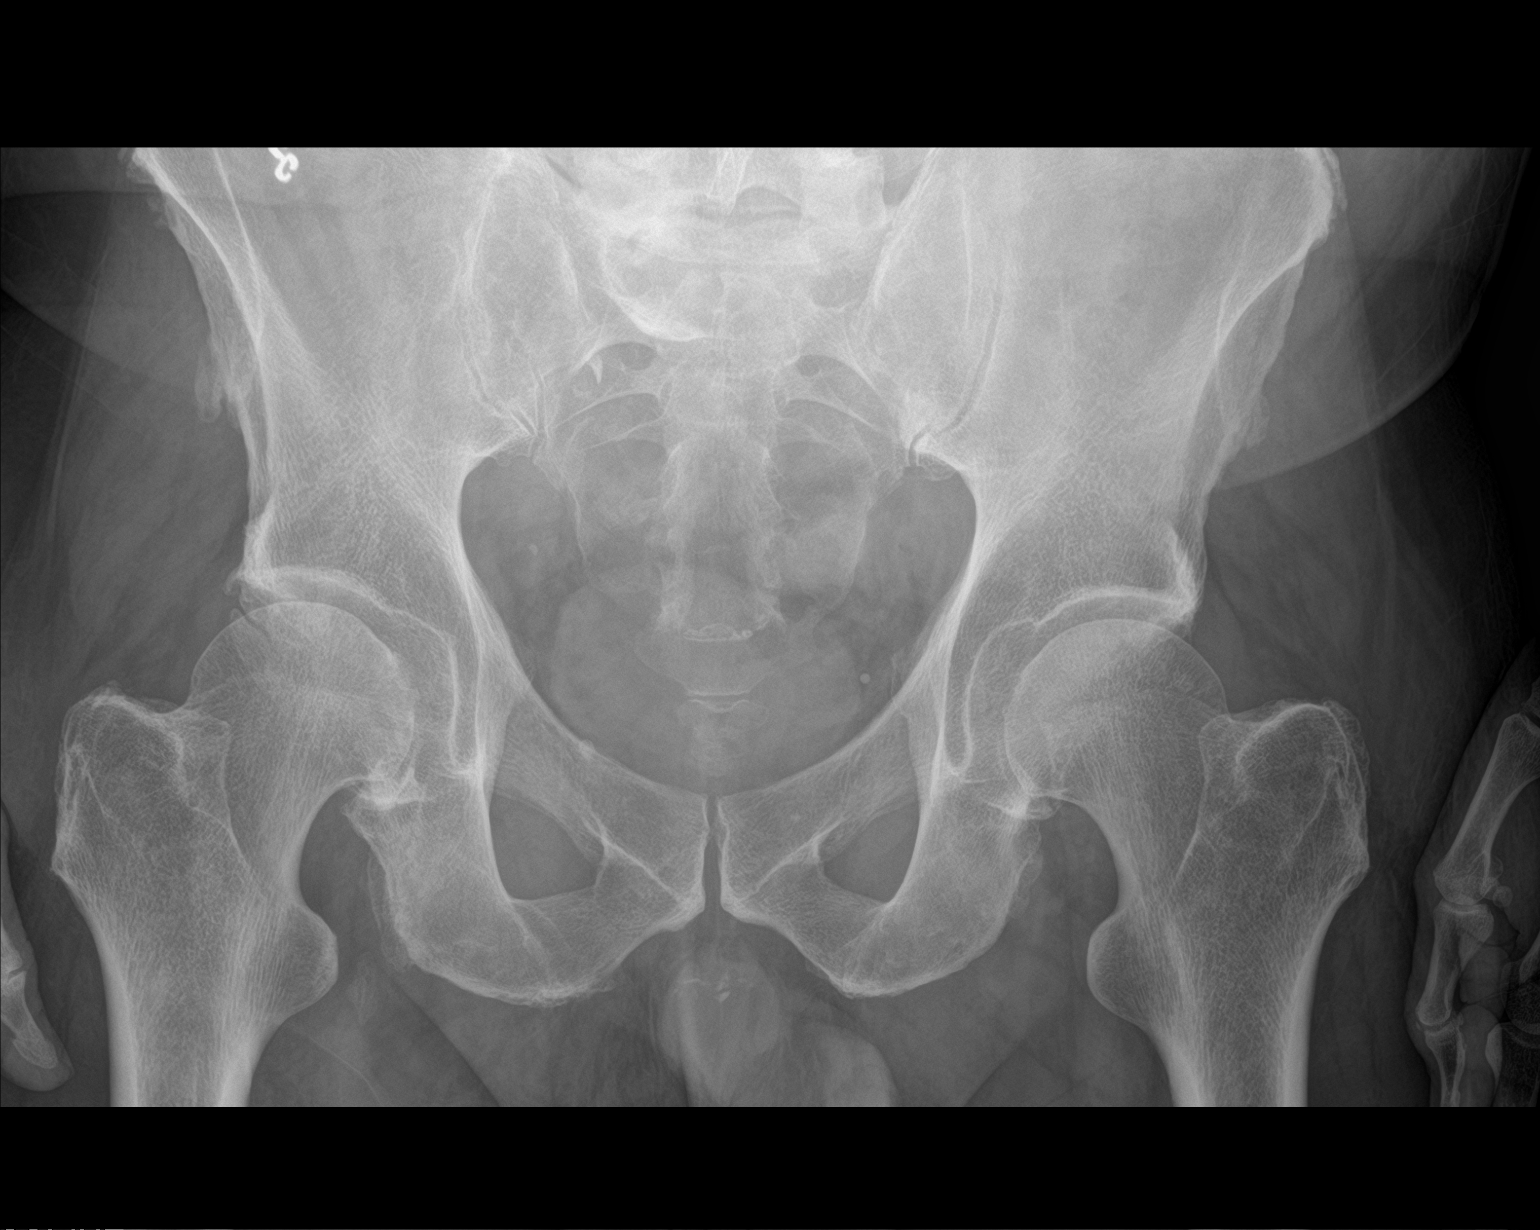

[2 of 2 positions shown; findings below may reference images not displayed]

FINDINGS: small cluster of calculi measuring up to 3 mm projects over the
lower pole left kidney. Previously noted calculus at the level of
the left L3 transverse process is no longer seen. Stable bilateral
pelvic phleboliths. Normal bowel gas pattern. Mild spondylitic
changes in the lumbar spine.
IMPRESSION: 1. Left nephrolithiasis. The ureteral calculus seen on prior study
is no longer identified.

## 2023-09-21 ENCOUNTER — Encounter: Payer: Self-pay | Admitting: Urology

## 2023-09-21 ENCOUNTER — Ambulatory Visit: Payer: Medicare (Managed Care) | Admitting: Urology

## 2023-09-21 VITALS — BP 134/80 | HR 70 | Ht 69.0 in | Wt 203.0 lb

## 2023-09-21 DIAGNOSIS — N35011 Post-traumatic bulbous urethral stricture: Secondary | ICD-10-CM

## 2023-09-21 DIAGNOSIS — R300 Dysuria: Secondary | ICD-10-CM | POA: Diagnosis not present

## 2023-09-21 LAB — URINALYSIS, COMPLETE
Bilirubin, UA: NEGATIVE
Ketones, UA: NEGATIVE
Nitrite, UA: NEGATIVE
Specific Gravity, UA: 1.015 (ref 1.005–1.030)
Urobilinogen, Ur: 0.2 mg/dL (ref 0.2–1.0)
pH, UA: 6 (ref 5.0–7.5)

## 2023-09-21 LAB — MICROSCOPIC EXAMINATION: WBC, UA: >30 /HPF — AB (ref 0–5)

## 2023-09-21 MED ORDER — DOXYCYCLINE HYCLATE 100 MG PO CAPS: 100.0000 mg | ORAL_CAPSULE | Freq: Two times a day (BID) | ORAL | 0 refills | Status: AC
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
## 2023-09-25 LAB — CULTURE, URINE COMPREHENSIVE

## 2023-09-30 ENCOUNTER — Ambulatory Visit: Payer: Self-pay | Admitting: Urology

## 2023-10-01 ENCOUNTER — Ambulatory Visit
Admission: RE | Admit: 2023-10-01 | Discharge: 2023-10-01 | Disposition: A | Discharge status: Home / Self Care | Payer: Medicare (Managed Care) | Source: Ambulatory Visit | Attending: Oncology | Admitting: Oncology

## 2023-10-01 DIAGNOSIS — C210 Malignant neoplasm of anus, unspecified: Secondary | ICD-10-CM | POA: Diagnosis present

## 2023-10-05 ENCOUNTER — Ambulatory Visit: Admission: RE | Admit: 2023-10-05 | Payer: Medicare (Managed Care) | Source: Ambulatory Visit

## 2023-10-12 ENCOUNTER — Encounter: Payer: Self-pay | Admitting: Oncology

## 2023-10-12 ENCOUNTER — Inpatient Hospital Stay: Payer: Medicare (Managed Care) | Attending: Oncology

## 2023-10-12 ENCOUNTER — Inpatient Hospital Stay: Payer: Medicare (Managed Care) | Admitting: Oncology

## 2023-10-12 VITALS — BP 113/78 | HR 75 | Temp 97.3°F | Resp 18 | Wt 204.3 lb

## 2023-10-12 DIAGNOSIS — C210 Malignant neoplasm of anus, unspecified: Secondary | ICD-10-CM

## 2023-10-12 DIAGNOSIS — I129 Hypertensive chronic kidney disease with stage 1 through stage 4 chronic kidney disease, or unspecified chronic kidney disease: Secondary | ICD-10-CM | POA: Insufficient documentation

## 2023-10-12 DIAGNOSIS — N183 Chronic kidney disease, stage 3 unspecified: Secondary | ICD-10-CM | POA: Diagnosis not present

## 2023-10-12 DIAGNOSIS — N1831 Chronic kidney disease, stage 3a: Secondary | ICD-10-CM

## 2023-10-12 DIAGNOSIS — D751 Secondary polycythemia: Secondary | ICD-10-CM | POA: Diagnosis not present

## 2023-10-12 LAB — CBC WITH DIFFERENTIAL (CANCER CENTER ONLY)
Abs Immature Granulocytes: 0.05 K/uL (ref 0.00–0.07)
Basophils Absolute: 0.1 K/uL (ref 0.0–0.1)
Basophils Relative: 1 %
Eosinophils Absolute: 0.4 K/uL (ref 0.0–0.5)
Eosinophils Relative: 4 %
HCT: 52.1 % — ABNORMAL HIGH (ref 39.0–52.0)
Hemoglobin: 17.8 g/dL — ABNORMAL HIGH (ref 13.0–17.0)
Immature Granulocytes: 1 %
Lymphocytes Relative: 21 %
Lymphs Abs: 1.9 K/uL (ref 0.7–4.0)
MCH: 30.1 pg (ref 26.0–34.0)
MCHC: 34.2 g/dL (ref 30.0–36.0)
MCV: 88.2 fL (ref 80.0–100.0)
Monocytes Absolute: 1.1 K/uL — ABNORMAL HIGH (ref 0.1–1.0)
Monocytes Relative: 11 %
Neutro Abs: 5.9 K/uL (ref 1.7–7.7)
Neutrophils Relative %: 62 %
Platelet Count: 259 K/uL (ref 150–400)
RBC: 5.91 MIL/uL — ABNORMAL HIGH (ref 4.22–5.81)
RDW: 14.1 % (ref 11.5–15.5)
WBC Count: 9.4 K/uL (ref 4.0–10.5)
nRBC: 0 % (ref 0.0–0.2)

## 2023-10-12 LAB — CMP (CANCER CENTER ONLY)
ALT: 35 U/L (ref 0–44)
AST: 30 U/L (ref 15–41)
Albumin: 3.9 g/dL (ref 3.5–5.0)
Alkaline Phosphatase: 68 U/L (ref 38–126)
Anion gap: 11 (ref 5–15)
BUN: 24 mg/dL — ABNORMAL HIGH (ref 8–23)
CO2: 18 mmol/L — ABNORMAL LOW (ref 22–32)
Calcium: 9.1 mg/dL (ref 8.9–10.3)
Chloride: 105 mmol/L (ref 98–111)
Creatinine: 1.41 mg/dL — ABNORMAL HIGH (ref 0.61–1.24)
GFR, Estimated: 54 mL/min — ABNORMAL LOW (ref >60)
Glucose, Bld: 162 mg/dL — ABNORMAL HIGH (ref 70–99)
Potassium: 4 mmol/L (ref 3.5–5.1)
Sodium: 134 mmol/L — ABNORMAL LOW (ref 135–145)
Total Bilirubin: 0.8 mg/dL (ref 0.0–1.2)
Total Protein: 7.6 g/dL (ref 6.5–8.1)
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
## 2023-10-12 NOTE — Assessment & Plan Note (Addendum)
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 DRE/anoscopy by surgery 8 weeks after concurrent chemotherapy and radiation showed complete response. Recommend patient to continue follow-up with surgeon for DRE/anoscopy/inguinal node palpation every 6 months x 2-3 years, Labs are reviewed and discussed with patient.  Repeat CT scan in 1 year.  follow up with Dr. Tye -

## 2023-10-12 NOTE — Assessment & Plan Note (Addendum)
 persistently elevated, recommend patient to obtain sleep study This is also possibly secondary to Jardiance side effects. Check JAK2 mutation and BCR-ABL 1 FISH after next visit.

## 2023-10-12 NOTE — Assessment & Plan Note (Signed)
 Avoid nephrotoxins.  Encourage oral hydration Optimize glycemic control

## 2023-10-15 ENCOUNTER — Telehealth: Payer: Self-pay | Admitting: Sleep Medicine

## 2023-10-15 NOTE — Telephone Encounter (Signed)
 LVMTCB to schedule sleep consult.

## 2023-10-30 ENCOUNTER — Ambulatory Visit: Payer: Medicare (Managed Care) | Admitting: Sleep Medicine

## 2023-10-30 ENCOUNTER — Other Ambulatory Visit: Payer: Medicare (Managed Care)

## 2023-10-30 ENCOUNTER — Other Ambulatory Visit: Payer: Self-pay

## 2023-10-30 ENCOUNTER — Encounter: Payer: Self-pay | Admitting: Sleep Medicine

## 2023-10-30 ENCOUNTER — Other Ambulatory Visit: Payer: Self-pay | Admitting: *Deleted

## 2023-10-30 VITALS — BP 120/80 | HR 75 | Temp 97.8°F | Ht 69.0 in | Wt 208.0 lb

## 2023-10-30 DIAGNOSIS — I10 Essential (primary) hypertension: Secondary | ICD-10-CM | POA: Diagnosis not present

## 2023-10-30 DIAGNOSIS — G4733 Obstructive sleep apnea (adult) (pediatric): Secondary | ICD-10-CM | POA: Diagnosis not present

## 2023-10-30 DIAGNOSIS — R300 Dysuria: Secondary | ICD-10-CM

## 2023-10-30 DIAGNOSIS — R338 Other retention of urine: Secondary | ICD-10-CM

## 2023-10-30 LAB — URINALYSIS, COMPLETE
Bilirubin, UA: NEGATIVE
Ketones, UA: NEGATIVE
Nitrite, UA: NEGATIVE
Protein,UA: NEGATIVE
Specific Gravity, UA: 1.02 (ref 1.005–1.030)
Urobilinogen, Ur: 0.2 mg/dL (ref 0.2–1.0)
pH, UA: 6 (ref 5.0–7.5)

## 2023-10-30 LAB — MICROSCOPIC EXAMINATION: WBC, UA: >30 /HPF — AB (ref 0–5)

## 2023-10-30 NOTE — Patient Instructions (Signed)
 Joseph Mcgee

## 2023-10-30 NOTE — Addendum Note (Signed)
 Addended by: GENITA HARLENE CROME on: 10/30/2023 11:34 AM   Modules accepted: Orders

## 2023-10-30 NOTE — Progress Notes (Signed)
 Name:Joseph Mcgee MRN: [id number] DOB: 1955/03/14   CHIEF COMPLAINT:  EXCESSIVE DAYTIME SLEEPINESS   HISTORY OF PRESENT ILLNESS: Joseph Mcgee is a 68 y.o. w/ a h/o HTN, DMII and obesity who presents for c/o loud snoring, witnessed apnea and excessive daytime sleepiness which has been present for several years. Reports that he was found to have erythrocytosis and is being followed by Heme/Onc. Reports nocturnal awakenings due to nocturia, however does not have difficulty falling back to sleep. Denies any significant weight changes. Denies morning headaches, RLS symptoms, dream enactment, cataplexy, hypnagogic or hypnapompic hallucinations. Denies a family history of sleep apnea. Denies drowsy driving. Drinks decaf coffee and tea on occasion, denies alcohol, tobacco illicit drug use.   Bedtime 11 pm- 12 am Sleep onset 10 mins Rise time 9-10 am   EPWORTH SLEEP SCORE 15    10/30/2023   10:00 AM  Results of the Epworth flowsheet  Sitting and reading 3  Watching TV 3  Sitting, inactive in a public place (e.g. a theatre or a meeting) 2  As a passenger in a car for an hour without a break 2  Lying down to rest in the afternoon when circumstances permit 2  Sitting and talking to someone 0  Sitting quietly after a lunch without alcohol 3  In a car, while stopped for a few minutes in traffic 0  Total score 15    PAST MEDICAL HISTORY :   has a past medical history of Anal squamous cell carcinoma (HCC), Aortic atherosclerosis (HCC), Arthritis, Cataract cortical, senile, COPD (chronic obstructive pulmonary disease) (HCC), Diabetes mellitus without complication (HCC), Dyspnea, Erythrocytosis, GERD (gastroesophageal reflux disease), History of kidney stones, Hypercholesterolemia, Hypertension, and Pneumonia (02/2020).  has a past surgical history that includes Hernia repair (2012); Tonsillectomy (1959); Bladder surgery (1964); Exam under anesthesia with hemorrhoidectomy (N/A, 03/03/2021);  Colonoscopy with propofol  (N/A, 03/03/2021); Colonoscopy with propofol  (N/A, 03/03/2021); Extracorporeal shock wave lithotripsy (Left, 04/14/2021); Portacath placement (Right, 04/20/2021); and Cystoscopy with urethral dilatation (N/A, 02/23/2022). Prior to Admission medications   Medication Sig Start Date End Date Taking? Authorizing Provider  aspirin EC 81 MG tablet Take 1 tablet by mouth daily.   Yes [provider]  cetirizine (ZYRTEC) 10 MG tablet Take 10 mg by mouth daily as needed for allergies.   Yes [provider]  cholecalciferol (VITAMIN D3) 25 MCG (1000 UNIT) tablet Take 1,000 Units by mouth daily.   Yes [provider]  Continuous Glucose Receiver (DEXCOM G7 RECEIVER) DEVI 1 each by Other route. 10/29/23  Yes [provider]  Continuous Glucose Sensor (DEXCOM G7 SENSOR) MISC 1 each by Other route. 10/29/23  Yes [provider]  ezetimibe (ZETIA) 10 MG tablet Take 10 mg by mouth daily.   Yes [provider]  Fingerstix Lancets MISC 1 each by Other route. 09/19/23 09/18/24 Yes [provider]  hydrochlorothiazide (HYDRODIURIL) 25 MG tablet Take 25 mg by mouth daily.   Yes [provider]  insulin  glargine, 1 Unit Dial, (TOUJEO SOLOSTAR) 300 UNIT/ML Solostar Pen Inject 80 Units into the skin at bedtime.   Yes [provider]  insulin  lispro (HUMALOG) 100 UNIT/ML KwikPen Inject into the skin. 30 units @ breakfast 45 units at lunch and supper 3 times a day 03/07/22  Yes [provider]  JARDIANCE 25 MG TABS tablet Take 25 mg by mouth daily.   Yes [provider]  losartan (COZAAR) 50 MG tablet Take 50 mg by mouth daily.  Yes [provider]  metFORMIN (GLUCOPHAGE) 1000 MG tablet Take 1,000 mg by mouth 2 (two) times daily.   Yes [provider]  Omega-3 Fatty Acids (FISH OIL) 1000 MG CAPS Take 1,000 mg by mouth daily.   Yes [provider]  OZEMPIC, 0.25 OR 0.5 MG/DOSE, 2 MG/3ML  SOPN Inject 0.5 mg into the skin once a week.   Yes [provider]  vitamin B-12 (CYANOCOBALAMIN) 1000 MCG tablet Take 1,000 mcg by mouth daily.   Yes [provider]  zinc gluconate 50 MG tablet Take 50 mg by mouth daily.   Yes [provider]   Allergies  Allergen Reactions   Enalapril Maleate Cough   Lisinopril Cough   Lovastatin     Muscle Pain    FAMILY HISTORY:  family history includes Dementia in his mother; Diabetes in his mother; Hypertension in his mother; Lung cancer in his paternal uncle; Stomach cancer in his maternal uncle; Stroke in his father. SOCIAL HISTORY:  reports that he quit smoking about 10 years ago. His smoking use included cigarettes. He started smoking about 52 years ago. He has a 63 pack-year smoking history. He does not have any smokeless tobacco history on file. He reports that he does not drink alcohol and does not use drugs.   Review of Systems:  Gen:  Denies  fever, sweats, chills weight loss  HEENT: Denies blurred vision, double vision, ear pain, eye pain, hearing loss, nose bleeds, sore throat Cardiac:  No dizziness, chest pain or heaviness, chest tightness,edema, No JVD Resp:   No cough, -sputum production, -shortness of breath,-wheezing, -hemoptysis,  Gi: Denies swallowing difficulty, stomach pain, nausea or vomiting, diarrhea, constipation, bowel incontinence Gu:  Denies bladder incontinence, burning urine Ext:   Denies Joint pain, stiffness or swelling Skin: Denies  skin rash, easy bruising or bleeding or hives Endoc:  Denies polyuria, polydipsia , polyphagia or weight change Psych:   Denies depression, insomnia or hallucinations  Other:  All other systems negative  VITAL SIGNS: BP 120/80   Pulse 75   Temp 97.8 F (36.6 C)   Ht 5' 9 (1.753 m)   Wt 208 lb (94.3 kg)   SpO2 96%   BMI 30.72 kg/m    Physical Examination:   General Appearance: No distress  EYES PERRLA, EOM intact.   NECK Supple, No  JVD Pulmonary: normal breath sounds, No wheezing.  CardiovascularNormal S1,S2.  No m/r/g.   Abdomen: Benign, Soft, non-tender. Skin:   warm, no rashes, no ecchymosis  Extremities: normal, no cyanosis, clubbing. Neuro:without focal findings,  speech normal  PSYCHIATRIC: Mood, affect within normal limits.   ASSESSMENT AND PLAN  OSA I suspect that OSA is likely present due to clinical presentation. Discussed the consequences of untreated sleep apnea. Advised not to drive drowsy for safety of patient and others. Will complete further evaluation with a home sleep study and follow up to review results.    HTN Stable, on current management. Following with PCP.    MEDICATION ADJUSTMENTS/LABS AND TESTS ORDERED: Recommend Sleep Study   Patient  satisfied with Plan of action and management. All questions answered  Follow up to review HST results and treatment plan.   I spent a total of 54 minutes reviewing chart data, face-to-face evaluation with the patient, counseling and coordination of care as detailed above.    Tamarion Haymond, M.D.  Sleep Medicine [facility]

## 2023-11-07 ENCOUNTER — Ambulatory Visit: Payer: Self-pay | Admitting: Urology

## 2023-11-07 LAB — CULTURE, URINE COMPREHENSIVE

## 2023-11-08 ENCOUNTER — Other Ambulatory Visit: Payer: Self-pay | Admitting: *Deleted

## 2023-11-08 MED ORDER — SULFAMETHOXAZOLE-TRIMETHOPRIM 800-160 MG PO TABS: 1.0000 | ORAL_TABLET | Freq: Two times a day (BID) | ORAL | 0 refills | Status: DC

## 2023-12-03 ENCOUNTER — Encounter: Payer: Self-pay | Admitting: Urology

## 2023-12-18 ENCOUNTER — Telehealth: Payer: Self-pay | Admitting: Oncology

## 2023-12-18 NOTE — Telephone Encounter (Signed)
 Hello, this patient is back here on campus asking for additional clarification on his bill from [facility] on 10/01/2023. We reviewed the notes on the authorization and the account notes. Looks like Joseph Mcgee last documented that the claim was denied. Can someone please call this patient and speak with him. He is very confused and would like to know what is going on. I will also add this communication to the patients chart. Thank you.   Joseph Mcgee just pulled the account , The claim did deny and is still in insurance review, no payment has been made from insurance yet. - I will add a note to my calendar to follow up on this.   Joseph Mcgee will notify patient that someone will follow-up with him 15-30 days.  Joseph Mcgee  the original Mcgee states the patient does not have any benefits for this procedure.

## 2024-01-16 NOTE — Patient Instructions (Signed)
 Go to amaz[employer] and buy CoQ10 (either PURE or [place] brand)

## 2024-01-16 NOTE — Progress Notes (Signed)
 CC: Preventative Health Exam  HPI  Joseph Mcgee is a 68 y.o. here for preventative health exam and subsequent medicare wellness  Preventative health exam: Complaining of dysuria.  Chronic medical issues stable and tolerating medications without adverse effects.  Unclear about regular exercise or specific healthy diet.  No exertional cp or syncopal episodes.  No urinary issues or rectal pain/bleeding.  No personal or family history of prostate cancer.  Denies any tobacco use.    ROS Review of systems is unremarkable for any active cardiac, respiratory, GI, GU, hematologic, neurologic, dermatologic, HEENT, or psychiatric symptoms except as noted above.  No fevers, chills, or constitutional symptoms.   Current Outpatient Medications  Medication Sig Dispense Refill  . aspirin 81 MG EC tablet Take 81 mg by mouth once daily    . blood glucose diagnostic test strip 1 each (1 strip total) 2 (two) times daily Use as instructed. (Patient taking differently: 1 strip once daily Use as instructed.) 200 each 4  . blood glucose meter kit as directed 1 each 0  . cetirizine (ZYRTEC) 10 MG tablet Take 10 mg by mouth at bedtime as needed    . cholecalciferol (VITAMIN D3) 1000 unit tablet Take 1,000 Units by mouth once daily    . cyanocobalamin (VITAMIN B12) 1000 MCG tablet Take 1,000 mcg by mouth once daily    . empagliflozin (JARDIANCE) 25 mg tablet Take 1 tablet (25 mg total) by mouth once daily 90 tablet 3  . ezetimibe (ZETIA) 10 mg tablet TAKE ONE TABLET BY MOUTH ONCE DAILY 100 tablet 1  . hydroCHLOROthiazide (HYDRODIURIL) 25 MG tablet Take 1 tablet (25 mg total) by mouth once daily 100 tablet 1  . ibuprofen  (MOTRIN ) 800 MG tablet Take 800 mg by mouth every 8 (eight) hours as needed    . insulin  glargine U-300 conc (TOUJEO MAX U-300 SOLOSTAR) 300 unit/mL (3 mL) InPn Inject 80 Units subcutaneously once daily 42 mL 11  . insulin  LISPRO (HUMALOG KWIKPEN) pen injector (concentration 100 units/mL) 30 units  before breakfast, 45 units before lunch and 45 units before supper. Max daily dose is 120 units. 45 mL 5  . lancets Use 1 each 2 (two) times daily Use as instructed. (Patient taking differently: Use 1 each once daily Use as instructed.) 200 each 4  . losartan (COZAAR) 50 MG tablet Take 1 tablet (50 mg total) by mouth once daily 90 tablet 1  . metFORMIN (GLUCOPHAGE) 1000 MG tablet Take 1 tablet (1,000 mg total) by mouth 2 (two) times daily with meals 180 tablet 3  . omega-3 fatty acids-fish oil 360-1,200 mg Cap Take 1 capsule by mouth once daily    . semaglutide (OZEMPIC) 0.25 mg or 0.5 mg (2 mg/3 mL) pen injector Inject 0.75 mLs (0.5 mg total) subcutaneously once a week 9 mL 3  . zinc 50 mg Tab Take 50 mg by mouth once daily     No current facility-administered medications for this visit.    Allergies as of 01/16/2024 - Reviewed 01/16/2024  Allergen Reaction Noted  . Lisinopril Cough 04/25/2017  . Lovastatin Muscle Pain 09/29/2013  . Vasotec [enalapril maleate] Cough 09/29/2013    Patient Active Problem List  Diagnosis  . Type 2 diabetes mellitus with hyperlipidemia (A1c 7% - 11/29/23) - followed by Dr. Damian  . Mixed hyperlipidemia (LDL 120, Trig 217 - 01/11/24) - intolerant to statins; currently on zetia  . Essential hypertension  . Chronic right shoulder pain  . Mild nonproliferative diabetic retinopathy of  both eyes without macular edema associated with type 2 diabetes mellitus (CMS/HHS-HCC)  . Hypertensive retinopathy of both eyes  . Type 2 diabetes mellitus with mild nonproliferative retinopathy without macular edema, with long-term current use of insulin  (CMS/HHS-HCC)  . Statin intolerance  . History of tobacco use  . Anal squamous cell carcinoma (03/03/21) - followed by Dr. Babara and Dr. Tye  . Left nephrolithiasis (11mm on CT 02/2021)  . Aortic atherosclerosis (CMS-HCC) - CT 02/2021  . Medicare annual wellness visit, initial 01/04/22  . Medicare annual wellness visit, subsequent  01/16/24  . Erythrocytosis (Hgb 18.2 - 07/04/23)  . Benign prostatic hyperplasia with weak urinary stream    Past Medical History:  Diagnosis Date  . Anal squamous cell carcinoma (03/03/21) - followed by Dr. Babara 03/23/2021  . Aortic atherosclerosis    CT scan 09/2020  . Arthritis   . Cataract cortical, senile   . Erectile dysfunction   . Essential hypertension   . GERD (gastroesophageal reflux disease)   . Left nephrolithiasis (11mm on CT 02/2021) 03/23/2021  . Pneumonia due to COVID-19 virus 02/2020  . Pure hypercholesterolemia    Intolerant to statins  . Type 2 diabetes mellitus (CMS/HHS-HCC)     Past Surgical History:  Procedure Laterality Date  . TONSILLECTOMY  10/29/1957  . rectal surgery  03/03/2021   mass removal  .  Bladder surgery, 1964    . HERNIA REPAIR    . Right umbilical hernia repair and inguinal hernia repair, 05/18/10      Family History  Problem Relation Name Age of Onset  . Dementia Mother Jaye Polidori   . Alzheimer's disease Mother Salmaan Patchin   . Diabetes type II Mother Gabrian Hoque   . High blood pressure (Hypertension) Mother Ryne Mctigue   . Obesity Mother Lenzy Kerschner   . No Known Problems Sister    . Stroke Father Eric WENDI Hoit   . Coronary Artery Disease (Blocked arteries around heart) Father Eric WENDI Hoit   . High blood pressure (Hypertension) Father Eric WENDI Hoit   . Stroke Paternal Grandmother Susie Zaniel Marineau   . Alzheimer's disease Maternal [facility]   . High blood pressure (Hypertension) Sister Particia Kub   . Obesity Sister Particia Kub     Social History   Socioeconomic History  . Marital status: Married  Tobacco Use  . Smoking status: Former    Current packs/day: 0.00    Average packs/day: 1 pack/day for 41.4 years (41.4 ttl pk-yrs)    Types: Cigarettes    Start date: 07/22/1971    Quit date: 12/08/2012    Years since quitting: 11.1    Passive exposure: Past  . Smokeless tobacco: Never  . Tobacco comments:     no tobacco since my quit date. Start date approximate.  Vaping Use  . Vaping status: Never Used  Substance and Sexual Activity  . Alcohol use: Never    Alcohol/week: 0.0 standard drinks of alcohol  . Drug use: Never  . Sexual activity: Never  Social History Narrative   Marital status- Married   Lives with wife   Employment- Actor (merchandiser, retail)   Supplements- [employer] Yeast Rice, Fish Oil, MVI, CoQ10   Exercise hx- Walks every other day   Religious affliation- [employer]   Social Drivers of Health   Financial Resource Strain: Low Risk  (01/13/2024)   Overall Financial Resource Strain (CARDIA)   . Difficulty of Paying Living Expenses: Not hard at all  Food Insecurity:  No Food Insecurity (01/13/2024)   Hunger Vital Sign   . Worried About Programme Researcher, Broadcasting/film/video in the Last Year: Never true   . Ran Out of Food in the Last Year: Never true  Transportation Needs: No Transportation Needs (01/13/2024)   PRAPARE - Transportation   . Lack of Transportation (Medical): No   . Lack of Transportation (Non-Medical): No  Housing Stability: Low Risk  (01/13/2024)   Housing Stability Vital Sign   . Unable to Pay for Housing in the Last Year: No   . Number of Times Moved in the Last Year: 0   . Homeless in the Last Year: No    Health Maintenance  Topic Date Due  . Shingrix (1 of 2) Never done  . RSV Immunization Pregnant or 50+ (1 - Risk 50-74 years 1-dose series) Never done  . Monofilament Foot Exam  05/13/2020  . Lung Cancer Screening  09/25/2023  . Depression Screening  01/12/2024  . Medicare Subsequent AWV [id number]  01/13/2024  . Annual Physical/Well Child Check  01/13/2024  . Diabetes Eye Assessment Exam  02/05/2024  . Annual Urine Albumin Creatinine Ratio  05/24/2024  . Hemoglobin A1C  05/29/2024  . Creatinine Level  01/10/2025  . Potassium Level  01/10/2025  . Lipid Panel  01/10/2025  . Serum Calcium  01/10/2025  . Adult Tetanus (Td And Tdap)  12/06/2031  . Medicare Initial AWV [id number]   Completed  . Pneumococcal Vaccine: 50+  Completed  . Hepatitis C Screen  Completed  . AAA Screen  Completed  . Hib Vaccines  Aged Out  . Hepatitis A Vaccines  Aged Out  . Meningococcal B Vaccine  Aged Out  . Meningococcal ACWY Vaccine  Aged Out  . HPV Vaccines  Aged Out  . COVID-19 Vaccine  Discontinued  . Diabetes Education  Discontinued  . Colorectal Cancer Screening  Discontinued  . PSA  Discontinued  . Influenza Vaccine  Discontinued    Vitals:   01/16/24 1126  BP: 122/84  Pulse: 71  SpO2: 97%  Weight: 92.6 kg (204 lb 3.2 oz)  Height: 175.3 cm (5' 9)  PainSc: 0-No pain   Body mass index is 30.16 kg/m.  Exam  General. Well appearing; NAD; VS reviewed     Eyes. Sclera and conjunctiva clear; Vision grossly intact; extraocular movements intact Neck. Supple. No swelling, masses, thyroid normal size, no masses palpated.  Lungs. Respirations unlabored; clear to auscultation bilaterally Cardiovascular. Heart regular rate and rhythm without murmurs, gallops, or rubs Abdomen. Soft; non tender; non distended; normoactive bowel sounds; no masses or organomegaly Lymph Nodes. No significant cervical or supraclavicular lymphadenopathy noted Musculoskeletal. No deformities; no active joint inflammation Extremities. no edema Skin. Normal color and turgor Pulses. Dorsalis pedis palpable and symmetric bilaterally Neurologic. Alert and oriented x3; CN 2-12 grossly intact; no focal deficits  Assessment and Plan  1. Preventative health exam- Stable exam except for elevated BMI.  CV screening labs reviewed with patient.  Hypertension well-controlled.  Hyperlipidemia not at goal.  Target LDL less than 100 with underlying diabetes.  Current LDL 120.  Intolerant to statins.  Continue with Zetia and focus more Mediterranean diet and exercise.  Diabetes well-controlled managed by Dr. Damian.  CBC wnls.  Patient declined PSA after discussing risk and benefits of testing.  Up to date on  colonoscopy (03/03/2021-repeat per GI/oncology).  Vaccinations reviewed.  Eligible for Shingrix.  Counseled on nutrition modification and exercise.  2. Subsequent medicare wellness Providers Rendering Care 1. Dr.  Alda Carpen (PCP) 2. Dr. Solum [facility] Endo) 3. Dr. Babara (Oncology) 4. Dr. Tye [facility] General surgery)   Functional Assessment (1) Hearing: Demonstrates no difficulty in hearing during normal conversation (2) Risk of Falls: Patient denies any falls or near falls in the last year, Gait steady without assistance during walk from waiting area to exam room (3) Home Safety: Patient feels secure in their home, There are operational smoke alarms in multiple areas of the home (4) Activities of Daily Living: Independently manages personal grooming and household chores, including cooking, cleaning and laundry. Manages Personal finances without assistance.   Depression Screening PHQ 2/9 last 3 flowsheet values     01/04/2022 01/12/2023 01/16/2024  PHQ-9 Depression Screening   Little interest or pleasure in doing things  0 0  Feeling down, depressed, or hopeless  0 0  (OBSOLETE) Little interest or pleasure in doing things 0    (OBSOLETE) Feeling down, depressed, or hopeless (or irritable for Teens only)? 0    (OBSOLETE) Total Score = 0        Depression Severity and Treatment Recommendations:  0-4= None  5-9= Mild / Treatment: Support, educate to call if worse; return in one month  10-14= Moderate / Treatment: Support, watchful waiting; Antidepressant or Psychotherapy  15-19= Moderately severe / Treatment: Antidepressant OR Psychotherapy  >= 20 = Major depression, severe / Antidepressant AND Psychotherapy   Cognitive Impairment Patient denies episodes of loosing things, being forgetful. Seems oriented to person, place and time.  Responses appear appropriate and timely to this observer.   PREVENTION PLAN   Cardiovascular: FLP assessed Diabetes: A1c or FBG assessed Glaucoma: Neg  per pt Hepatitis B (HBV) Vaccine:  Not Applicable Smoking Cessation:  Not Applicable   Other Personalized Health Advice   Encouraged patient to exercise 5 days a week, walking, water  aerobics, gentle stretching recommended. Increase dietary intake of fresh fruits and vegetables, reduce red meat to twice a week.   End of Life Counseling Patient does not have a living will in place; POA - None; Full Code, however if his condition is deemed terminable, he does not want to be kept alive via artificial means.     Medications and allergies reviewed and reconciled.  Preventative health and labs prior.  Recommend shingrix.   3. Dysuria Acute.  UA and urine cx pending.  Tx pending results.    Goals Addressed               This Visit's Progress   . * Improve blood sugar control (pt-stated)   On track   . * Maintain health/healthy lifestyle (pt-stated)   On track   . * Try to get right shoulder pain under control (pt-stated)   On track      Follow up: 6 months for reck; labs prior; UA and urine cx pending  ALDA CARPEN, MD *Some images could not be shown.

## 2024-01-31 ENCOUNTER — Telehealth: Payer: Self-pay | Admitting: *Deleted

## 2024-01-31 NOTE — Telephone Encounter (Signed)
 Patient called and states he is not urinating very much . He states he only dribbling a little since yesterday . Advised patient that be may have to go to the near ER if he starts having that much pain . He is coming back home tomorrow afternoon . He is in [place] .

## 2024-02-01 ENCOUNTER — Telehealth: Payer: Self-pay | Admitting: Urology

## 2024-02-01 NOTE — Telephone Encounter (Signed)
 Pt

## 2024-02-04 NOTE — Telephone Encounter (Signed)
 Pt LM on triage line stating that he passed a kidney stone over the weekend and has been able to void normally since.

## 2024-02-05 ENCOUNTER — Other Ambulatory Visit: Payer: Self-pay

## 2024-02-05 ENCOUNTER — Encounter: Payer: Self-pay | Admitting: Emergency Medicine

## 2024-02-05 ENCOUNTER — Emergency Department
Admission: EM | Admit: 2024-02-05 | Discharge: 2024-02-05 | Disposition: A | Discharge status: Home / Self Care | Payer: Medicare (Managed Care) | Source: Home / Self Care | Attending: Emergency Medicine | Admitting: Emergency Medicine

## 2024-02-05 ENCOUNTER — Emergency Department: Payer: Medicare (Managed Care)

## 2024-02-05 DIAGNOSIS — E871 Hypo-osmolality and hyponatremia: Secondary | ICD-10-CM | POA: Diagnosis not present

## 2024-02-05 DIAGNOSIS — J101 Influenza due to other identified influenza virus with other respiratory manifestations: Secondary | ICD-10-CM | POA: Diagnosis not present

## 2024-02-05 DIAGNOSIS — J449 Chronic obstructive pulmonary disease, unspecified: Secondary | ICD-10-CM | POA: Insufficient documentation

## 2024-02-05 DIAGNOSIS — Z85048 Personal history of other malignant neoplasm of rectum, rectosigmoid junction, and anus: Secondary | ICD-10-CM | POA: Insufficient documentation

## 2024-02-05 DIAGNOSIS — R110 Nausea: Secondary | ICD-10-CM | POA: Diagnosis present

## 2024-02-05 DIAGNOSIS — E860 Dehydration: Secondary | ICD-10-CM | POA: Diagnosis not present

## 2024-02-05 DIAGNOSIS — E119 Type 2 diabetes mellitus without complications: Secondary | ICD-10-CM | POA: Insufficient documentation

## 2024-02-05 DIAGNOSIS — I10 Essential (primary) hypertension: Secondary | ICD-10-CM | POA: Insufficient documentation

## 2024-02-05 LAB — URINALYSIS, ROUTINE W REFLEX MICROSCOPIC
Bilirubin Urine: NEGATIVE
Glucose, UA: >=500 mg/dL — AB
Ketones, ur: 5 mg/dL — AB
Leukocytes,Ua: NEGATIVE
Nitrite: NEGATIVE
Protein, ur: 100 mg/dL — AB
Specific Gravity, Urine: 1.03 (ref 1.005–1.030)
pH: 5 (ref 5.0–8.0)

## 2024-02-05 LAB — COMPREHENSIVE METABOLIC PANEL WITH GFR
ALT: 76 U/L — ABNORMAL HIGH (ref 0–44)
AST: 56 U/L — ABNORMAL HIGH (ref 15–41)
Albumin: 3.8 g/dL (ref 3.5–5.0)
Alkaline Phosphatase: 75 U/L (ref 38–126)
Anion gap: 20 — ABNORMAL HIGH (ref 5–15)
BUN: 38 mg/dL — ABNORMAL HIGH (ref 8–23)
CO2: 18 mmol/L — ABNORMAL LOW (ref 22–32)
Calcium: 8.9 mg/dL (ref 8.9–10.3)
Chloride: 94 mmol/L — ABNORMAL LOW (ref 98–111)
Creatinine, Ser: 1.7 mg/dL — ABNORMAL HIGH (ref 0.61–1.24)
GFR, Estimated: 43 mL/min — ABNORMAL LOW (ref >60)
Glucose, Bld: 207 mg/dL — ABNORMAL HIGH (ref 70–99)
Potassium: 4.4 mmol/L (ref 3.5–5.1)
Sodium: 132 mmol/L — ABNORMAL LOW (ref 135–145)
Total Bilirubin: 0.5 mg/dL (ref 0.0–1.2)
Total Protein: 7.3 g/dL (ref 6.5–8.1)

## 2024-02-05 LAB — CBC
HCT: 58.4 % — ABNORMAL HIGH (ref 39.0–52.0)
Hemoglobin: 19.5 g/dL — ABNORMAL HIGH (ref 13.0–17.0)
MCH: 29.2 pg (ref 26.0–34.0)
MCHC: 33.4 g/dL (ref 30.0–36.0)
MCV: 87.6 fL (ref 80.0–100.0)
Platelets: 219 K/uL (ref 150–400)
RBC: 6.67 MIL/uL — ABNORMAL HIGH (ref 4.22–5.81)
RDW: 14.7 % (ref 11.5–15.5)
WBC: 7.2 K/uL (ref 4.0–10.5)
nRBC: 0 % (ref 0.0–0.2)

## 2024-02-05 LAB — LIPASE, BLOOD: Lipase: 79 U/L — ABNORMAL HIGH (ref 11–51)

## 2024-02-05 LAB — RESP PANEL BY RT-PCR (RSV, FLU A&B, COVID)  RVPGX2
Influenza A by PCR: POSITIVE — AB
Influenza B by PCR: NEGATIVE
Resp Syncytial Virus by PCR: NEGATIVE
SARS Coronavirus 2 by RT PCR: NEGATIVE

## 2024-02-05 MED ORDER — ONDANSETRON 4 MG PO TBDP: 4.0000 mg | ORAL_TABLET | Freq: Three times a day (TID) | ORAL | 0 refills | Status: AC | PRN

## 2024-02-05 MED ORDER — LACTATED RINGERS IV BOLUS
1000.0000 mL | Freq: Once | INTRAVENOUS | Status: AC
  Administered 2024-02-05: 18:00:00 1000 mL via INTRAVENOUS

## 2024-02-05 MED ORDER — ONDANSETRON HCL 4 MG/2ML IJ SOLN
4.0000 mg | Freq: Once | INTRAMUSCULAR | Status: AC
  Administered 2024-02-05: 18:00:00 4 mg via INTRAVENOUS
  Filled 2024-02-05: qty 2

## 2024-02-05 MED ORDER — FAMOTIDINE 20 MG PO TABS: 20.0000 mg | ORAL_TABLET | Freq: Two times a day (BID) | ORAL | 0 refills | Status: AC

## 2024-02-05 MED ORDER — KETOROLAC TROMETHAMINE 15 MG/ML IJ SOLN
15.0000 mg | Freq: Once | INTRAMUSCULAR | Status: AC
  Administered 2024-02-05: 18:00:00 15 mg via INTRAVENOUS
  Filled 2024-02-05: qty 1

## 2024-02-05 NOTE — ED Notes (Signed)
 See triage note  Presents with some nausea and body aches with decreased appetite   Wife has the flu    Afebrile on arrival

## 2024-02-05 NOTE — ED Notes (Signed)
 Ultrasound tech at bedside

## 2024-02-05 NOTE — ED Triage Notes (Signed)
 First nurse note: Pt to ED via POV from [facility]. Pt reports abd pain and no appetite x4 days and has not taken meds. Pt reports nausea, diarrhea and black stools x2 days.
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
## 2024-02-12 ENCOUNTER — Other Ambulatory Visit: Payer: Self-pay

## 2024-02-12 ENCOUNTER — Emergency Department: Payer: Medicare (Managed Care)

## 2024-02-12 ENCOUNTER — Inpatient Hospital Stay
Admission: EM | Admit: 2024-02-12 | Discharge: 2024-02-14 | DRG: 871 | Disposition: A | Discharge status: Home / Self Care | Payer: Medicare (Managed Care) | Attending: Osteopathic Medicine | Admitting: Osteopathic Medicine

## 2024-02-12 DIAGNOSIS — Z9221 Personal history of antineoplastic chemotherapy: Secondary | ICD-10-CM | POA: Diagnosis not present

## 2024-02-12 DIAGNOSIS — Z923 Personal history of irradiation: Secondary | ICD-10-CM | POA: Diagnosis not present

## 2024-02-12 DIAGNOSIS — Z85048 Personal history of other malignant neoplasm of rectum, rectosigmoid junction, and anus: Secondary | ICD-10-CM

## 2024-02-12 DIAGNOSIS — Z833 Family history of diabetes mellitus: Secondary | ICD-10-CM

## 2024-02-12 DIAGNOSIS — I700 Atherosclerosis of aorta: Secondary | ICD-10-CM | POA: Diagnosis present

## 2024-02-12 DIAGNOSIS — H25019 Cortical age-related cataract, unspecified eye: Secondary | ICD-10-CM | POA: Diagnosis present

## 2024-02-12 DIAGNOSIS — A419 Sepsis, unspecified organism: Secondary | ICD-10-CM | POA: Diagnosis present

## 2024-02-12 DIAGNOSIS — Z8249 Family history of ischemic heart disease and other diseases of the circulatory system: Secondary | ICD-10-CM | POA: Diagnosis not present

## 2024-02-12 DIAGNOSIS — E7800 Pure hypercholesterolemia, unspecified: Secondary | ICD-10-CM | POA: Diagnosis present

## 2024-02-12 DIAGNOSIS — Z7984 Long term (current) use of oral hypoglycemic drugs: Secondary | ICD-10-CM | POA: Diagnosis not present

## 2024-02-12 DIAGNOSIS — Z79899 Other long term (current) drug therapy: Secondary | ICD-10-CM | POA: Diagnosis not present

## 2024-02-12 DIAGNOSIS — Z87891 Personal history of nicotine dependence: Secondary | ICD-10-CM | POA: Diagnosis not present

## 2024-02-12 DIAGNOSIS — J1000 Influenza due to other identified influenza virus with unspecified type of pneumonia: Secondary | ICD-10-CM | POA: Diagnosis present

## 2024-02-12 DIAGNOSIS — Z800 Family history of malignant neoplasm of digestive organs: Secondary | ICD-10-CM

## 2024-02-12 DIAGNOSIS — J189 Pneumonia, unspecified organism: Secondary | ICD-10-CM | POA: Diagnosis not present

## 2024-02-12 DIAGNOSIS — Z1152 Encounter for screening for COVID-19: Secondary | ICD-10-CM | POA: Diagnosis not present

## 2024-02-12 DIAGNOSIS — J440 Chronic obstructive pulmonary disease with acute lower respiratory infection: Secondary | ICD-10-CM | POA: Diagnosis present

## 2024-02-12 DIAGNOSIS — Z87442 Personal history of urinary calculi: Secondary | ICD-10-CM | POA: Diagnosis not present

## 2024-02-12 DIAGNOSIS — E1110 Type 2 diabetes mellitus with ketoacidosis without coma: Secondary | ICD-10-CM | POA: Diagnosis present

## 2024-02-12 DIAGNOSIS — J101 Influenza due to other identified influenza virus with other respiratory manifestations: Secondary | ICD-10-CM

## 2024-02-12 DIAGNOSIS — Z794 Long term (current) use of insulin: Secondary | ICD-10-CM

## 2024-02-12 DIAGNOSIS — R0602 Shortness of breath: Secondary | ICD-10-CM | POA: Diagnosis present

## 2024-02-12 DIAGNOSIS — E860 Dehydration: Secondary | ICD-10-CM | POA: Diagnosis present

## 2024-02-12 DIAGNOSIS — K219 Gastro-esophageal reflux disease without esophagitis: Secondary | ICD-10-CM | POA: Diagnosis present

## 2024-02-12 DIAGNOSIS — Z7985 Long-term (current) use of injectable non-insulin antidiabetic drugs: Secondary | ICD-10-CM | POA: Diagnosis not present

## 2024-02-12 DIAGNOSIS — I129 Hypertensive chronic kidney disease with stage 1 through stage 4 chronic kidney disease, or unspecified chronic kidney disease: Secondary | ICD-10-CM | POA: Diagnosis present

## 2024-02-12 DIAGNOSIS — M1990 Unspecified osteoarthritis, unspecified site: Secondary | ICD-10-CM | POA: Diagnosis present

## 2024-02-12 DIAGNOSIS — N400 Enlarged prostate without lower urinary tract symptoms: Secondary | ICD-10-CM | POA: Diagnosis present

## 2024-02-12 DIAGNOSIS — Z7982 Long term (current) use of aspirin: Secondary | ICD-10-CM

## 2024-02-12 DIAGNOSIS — E1310 Other specified diabetes mellitus with ketoacidosis without coma: Secondary | ICD-10-CM

## 2024-02-12 LAB — URINALYSIS, W/ REFLEX TO CULTURE (INFECTION SUSPECTED)
Bacteria, UA: NONE SEEN
Bilirubin Urine: NEGATIVE
Glucose, UA: >=500 mg/dL — AB
Ketones, ur: 20 mg/dL — AB
Leukocytes,Ua: NEGATIVE
Nitrite: NEGATIVE
Protein, ur: NEGATIVE mg/dL
Specific Gravity, Urine: 1.021 (ref 1.005–1.030)
pH: 5 (ref 5.0–8.0)

## 2024-02-12 LAB — BASIC METABOLIC PANEL WITH GFR
Anion gap: 23 — ABNORMAL HIGH (ref 5–15)
BUN: 22 mg/dL (ref 8–23)
CO2: 16 mmol/L — ABNORMAL LOW (ref 22–32)
Calcium: 8.7 mg/dL — ABNORMAL LOW (ref 8.9–10.3)
Chloride: 94 mmol/L — ABNORMAL LOW (ref 98–111)
Creatinine, Ser: 1.01 mg/dL (ref 0.61–1.24)
GFR, Estimated: >60 mL/min
Glucose, Bld: 228 mg/dL — ABNORMAL HIGH (ref 70–99)
Potassium: 4.1 mmol/L (ref 3.5–5.1)
Sodium: 133 mmol/L — ABNORMAL LOW (ref 135–145)

## 2024-02-12 LAB — TROPONIN T, HIGH SENSITIVITY
Troponin T High Sensitivity: <15 ng/L (ref 0–19)
Troponin T High Sensitivity: <15 ng/L (ref 0–19)

## 2024-02-12 LAB — CBC
HCT: 51 % (ref 39.0–52.0)
Hemoglobin: 17.3 g/dL — ABNORMAL HIGH (ref 13.0–17.0)
MCH: 29.5 pg (ref 26.0–34.0)
MCHC: 33.9 g/dL (ref 30.0–36.0)
MCV: 86.9 fL (ref 80.0–100.0)
Platelets: 446 K/uL — ABNORMAL HIGH (ref 150–400)
RBC: 5.87 MIL/uL — ABNORMAL HIGH (ref 4.22–5.81)
RDW: 13.7 % (ref 11.5–15.5)
WBC: 13.4 K/uL — ABNORMAL HIGH (ref 4.0–10.5)
nRBC: 0 % (ref 0.0–0.2)

## 2024-02-12 LAB — RESP PANEL BY RT-PCR (RSV, FLU A&B, COVID)  RVPGX2
Influenza A by PCR: POSITIVE — AB
Influenza B by PCR: NEGATIVE
Resp Syncytial Virus by PCR: NEGATIVE
SARS Coronavirus 2 by RT PCR: NEGATIVE

## 2024-02-12 LAB — PROCALCITONIN: Procalcitonin: 0.17 ng/mL

## 2024-02-12 LAB — PRO BRAIN NATRIURETIC PEPTIDE: Pro Brain Natriuretic Peptide: 244 pg/mL

## 2024-02-12 LAB — BETA-HYDROXYBUTYRIC ACID: Beta-Hydroxybutyric Acid: 5.12 mmol/L — ABNORMAL HIGH (ref 0.05–0.27)

## 2024-02-12 LAB — CBG MONITORING, ED: Glucose-Capillary: 225 mg/dL — ABNORMAL HIGH (ref 70–99)

## 2024-02-12 LAB — BLOOD GAS, VENOUS: Collection site: 708511

## 2024-02-12 MED ORDER — SODIUM CHLORIDE 0.9 % IV SOLN
2.0000 g | INTRAVENOUS | Status: DC
  Administered 2024-02-13: 2 g via INTRAVENOUS
  Filled 2024-02-12 (×3): qty 20

## 2024-02-12 MED ORDER — SODIUM CHLORIDE 0.9 % IV BOLUS
1000.0000 mL | Freq: Once | INTRAVENOUS | Status: AC
  Administered 2024-02-12: 1000 mL via INTRAVENOUS

## 2024-02-12 MED ORDER — LACTATED RINGERS IV BOLUS: 20.0000 mL/kg | Freq: Once | INTRAVENOUS | Status: DC

## 2024-02-12 MED ORDER — SODIUM CHLORIDE 0.9 % IV SOLN
100.0000 mg | Freq: Once | INTRAVENOUS | Status: AC
  Administered 2024-02-12: 100 mg via INTRAVENOUS
  Filled 2024-02-12: qty 100

## 2024-02-12 MED ORDER — DEXTROSE 50 % IV SOLN: 0.0000 mL | INTRAVENOUS | Status: DC | PRN

## 2024-02-12 MED ORDER — SODIUM CHLORIDE 0.9 % IV SOLN
100.0000 mg | Freq: Two times a day (BID) | INTRAVENOUS | Status: AC
  Administered 2024-02-13 (×2): 100 mg via INTRAVENOUS
  Filled 2024-02-12 (×3): qty 100

## 2024-02-12 MED ORDER — LACTATED RINGERS IV SOLN: INTRAVENOUS | Status: DC

## 2024-02-12 MED ORDER — ENOXAPARIN SODIUM 40 MG/0.4ML IJ SOSY
40.0000 mg | PREFILLED_SYRINGE | INTRAMUSCULAR | Status: DC
  Administered 2024-02-13: 40 mg via SUBCUTANEOUS
  Filled 2024-02-12 (×2): qty 0.4

## 2024-02-12 MED ORDER — IPRATROPIUM-ALBUTEROL 0.5-2.5 (3) MG/3ML IN SOLN
3.0000 mL | Freq: Four times a day (QID) | RESPIRATORY_TRACT | Status: DC
  Administered 2024-02-12 – 2024-02-13 (×2): 3 mL via RESPIRATORY_TRACT
  Filled 2024-02-12 (×2): qty 3

## 2024-02-12 MED ORDER — OSELTAMIVIR PHOSPHATE 75 MG PO CAPS
75.0000 mg | ORAL_CAPSULE | Freq: Two times a day (BID) | ORAL | Status: DC
  Administered 2024-02-12 – 2024-02-14 (×4): 75 mg via ORAL
  Filled 2024-02-12 (×4): qty 1

## 2024-02-12 MED ORDER — MELATONIN 5 MG PO TABS: 5.0000 mg | ORAL_TABLET | Freq: Every evening | ORAL | Status: DC | PRN

## 2024-02-12 MED ORDER — ACETAMINOPHEN 500 MG PO TABS: 500.0000 mg | ORAL_TABLET | Freq: Four times a day (QID) | ORAL | Status: DC | PRN

## 2024-02-12 MED ORDER — ONDANSETRON HCL 4 MG/2ML IJ SOLN
4.0000 mg | Freq: Once | INTRAMUSCULAR | Status: AC
  Administered 2024-02-12: 4 mg via INTRAVENOUS
  Filled 2024-02-12: qty 2

## 2024-02-12 MED ORDER — DEXTROSE IN LACTATED RINGERS 5 % IV SOLN: INTRAVENOUS | Status: DC

## 2024-02-12 MED ORDER — POTASSIUM CHLORIDE 10 MEQ/100ML IV SOLN
10.0000 meq | INTRAVENOUS | Status: AC
  Administered 2024-02-12 – 2024-02-13 (×2): 10 meq via INTRAVENOUS
  Filled 2024-02-12 (×2): qty 100

## 2024-02-12 MED ORDER — POLYETHYLENE GLYCOL 3350 17 G PO PACK: 17.0000 g | PACK | Freq: Every day | ORAL | Status: DC | PRN

## 2024-02-12 MED ORDER — SODIUM CHLORIDE 0.9 % IV SOLN
2.0000 g | Freq: Once | INTRAVENOUS | Status: AC
  Administered 2024-02-12: 2 g via INTRAVENOUS
  Filled 2024-02-12: qty 20

## 2024-02-12 MED ORDER — PROCHLORPERAZINE EDISYLATE 10 MG/2ML IJ SOLN
5.0000 mg | Freq: Four times a day (QID) | INTRAMUSCULAR | Status: DC | PRN
  Administered 2024-02-13: 5 mg via INTRAVENOUS
  Filled 2024-02-12: qty 2

## 2024-02-12 MED ORDER — INSULIN REGULAR(HUMAN) IN NACL 100-0.9 UT/100ML-% IV SOLN
INTRAVENOUS | Status: AC
  Administered 2024-02-12: 9.5 [IU]/h via INTRAVENOUS
  Filled 2024-02-12: qty 100
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
## 2024-02-12 NOTE — ED Provider Notes (Signed)
 [facility] Provider Note    Event Date/Time   First MD Initiated Contact with Patient 02/12/24 1926     (approximate)   History   Shortness of Breath   HPI  ADONIJAH BAENA is a 68 y.o. male with history of COPD, CKD, diabetes, presenting with shortness of breath and nausea also decreased p.o. intake.  Was seen here on the 16th and diagnosed with the flu.  Reports persistent and worsening symptoms.  He denies any chest pain or abdominal pain, no vomiting.  No leg swelling.  Per independent history from wife, he started having shortness of breath and cough on 13 December.  States that the cough and shortness of breath is new.  Had a positive flu result on the 16th.  States that he has just gone downhill from there.  Has had low-grade fevers, unable to tolerate p.o., has had some nausea and diarrhea.  Has not been able to take his medications due to the nausea.  On independent chart review, she was seen by primary care today, and scented to the emergency department.  Has had decreased p.o. intake for the last several days.  Does have nausea and diarrhea.  Has been trying to take Tylenol  and Advil .  She denies any abdominal pain.  Respiratory viral panel that was done on the 16th showed flu A+.     Physical Exam   Triage Vital Signs: ED Triage Vitals [02/12/24 1552]  Encounter Vitals Group     BP 120/72     Girls Systolic BP Percentile      Girls Diastolic BP Percentile      Boys Systolic BP Percentile      Boys Diastolic BP Percentile      Pulse Rate 84     Resp 18     Temp 97.7 F (36.5 C)     Temp Source Oral     SpO2 96 %     Weight 186 lb (84.4 kg)     Height 5' 9 (1.753 m)     Head Circumference      Peak Flow      Pain Score 6     Pain Loc      Pain Education      Exclude from Growth Chart     Most recent vital signs: Vitals:   02/12/24 2130 02/12/24 2200  BP: 137/85 (!) 158/95  Pulse: 86 91  Resp:    Temp:    SpO2: 95% 96%      General: Awake, no distress.  CV:  Good peripheral perfusion.  Resp:  Normal effort.  Tachypneic, no wheezing, crackles noted on the left Abd:  No distention.  Soft nontender Other:  Dry mucous membranes, no unilateral calf swelling or tenderness   ED Results / Procedures / Treatments   Labs (all labs ordered are listed, but only abnormal results are displayed) Labs Reviewed  RESP PANEL BY RT-PCR (RSV, FLU A&B, COVID)  RVPGX2 - Abnormal; Notable for the following components:      Result Value   Influenza A by PCR POSITIVE (*)    All other components within normal limits  BASIC METABOLIC PANEL WITH GFR - Abnormal; Notable for the following components:   Sodium 133 (*)    Chloride 94 (*)    CO2 16 (*)    Glucose, Bld 228 (*)    Calcium 8.7 (*)    Anion gap 23 (*)    All other components  within normal limits  CBC - Abnormal; Notable for the following components:   WBC 13.4 (*)    RBC 5.87 (*)    Hemoglobin 17.3 (*)    Platelets 446 (*)    All other components within normal limits  BLOOD GAS, VENOUS - Abnormal; Notable for the following components:   pCO2, Ven 35 (*)    Acid-base deficit 2.5 (*)    All other components within normal limits  BETA-HYDROXYBUTYRIC ACID - Abnormal; Notable for the following components:   Beta-Hydroxybutyric Acid 5.12 (*)    All other components within normal limits  PRO BRAIN NATRIURETIC PEPTIDE  URINALYSIS, W/ REFLEX TO CULTURE (INFECTION SUSPECTED)  PROCALCITONIN  TROPONIN T, HIGH SENSITIVITY  TROPONIN T, HIGH SENSITIVITY     EKG  EKG shows, sinus rhythm with PACs, rate 86, 1 QRS, normal QTc, right bundle branch block noted, no obvious ischemic ST elevation, baseline is wandering due to patient movement, not significantly changed compared to prior   RADIOLOGY On my independent interpretation, chest x-Tsugio shows left-sided opacity   PROCEDURES:  Critical Care performed: Yes, see critical care procedure note(s)  .Critical  Care  Performed by: Waymond Lorelle Cummins, MD Authorized by: Waymond Lorelle Cummins, MD   Critical care provider statement:    Critical care time (minutes):  40   Critical care was time spent personally by me on the following activities:  Development of treatment plan with patient or surrogate, discussions with consultants, evaluation of patient's response to treatment, examination of patient, ordering and review of laboratory studies, ordering and review of radiographic studies, ordering and performing treatments and interventions, pulse oximetry, re-evaluation of patient's condition and review of old charts    MEDICATIONS ORDERED IN ED: Medications  doxycycline  (VIBRAMYCIN ) 100 mg in sodium chloride  0.9 % 250 mL IVPB (100 mg Intravenous New Bag/Given 02/12/24 2050)  sodium chloride  0.9 % bolus 1,000 mL (0 mLs Intravenous Stopped 02/12/24 2157)  cefTRIAXone  (ROCEPHIN ) 2 g in sodium chloride  0.9 % 100 mL IVPB (0 g Intravenous Stopped 02/12/24 2030)  ondansetron  (ZOFRAN ) injection 4 mg (4 mg Intravenous Given 02/12/24 2159)     IMPRESSION / MDM / ASSESSMENT AND PLAN / ED COURSE  I reviewed the triage vital signs and the nursing notes.                              Differential diagnosis includes, but is not limited to, viral illness, pneumonia, dehydration, electrolyte derangements, arrhythmia, atypical ACS, DKA.  Labs, EKG, troponin, chest x-Jese, repeat respiratory viral panel.  IV fluids.  Patient's presentation is most consistent with acute presentation with potential threat to life or bodily function.  Independent interpretation of labs and imaging below.  Was able to finally get the beta-hydroxybutyrate back, it is elevated.  Received a callback from hospitalist before I can start the Endo tool.  Did discuss with her about the plan and she said that she can put that in.  Given his possible euglycemic DKA, pneumonia, decreased p.o. intake and dehydration, he will need to be admitted for further  management.  He is admitted.    Clinical Course as of 02/12/24 2226  Tue Feb 12, 2024  1947 DG Chest 2 View IMPRESSION: Mild patchy opacity at the left lung base, atelectasis versus pneumonia.   [TT]  1947 Independent review of the labs, leukocytosis noted, glucose is elevated with acidosis and anion gap.  Did consider if this was euglycemic DKA  or starvation ketoacidosis.  Will add on VBG, beta hydroxybutyrate.  Given some fluids here. [TT]  2031 Blood gas, venous(!) pH is normal. [TT]  2032 Troponin T High Sensitivity: <15 Not elevated [TT]  2032 Pro Brain Natriuretic Peptide: 244.0 Not elevated [TT]  2101 Influenza A By PCR(!): POSITIVE [TT]  2156 Called over to lab to ask about the beta-hydroxybutyrate, they state that they are running it manually given that it is elevated. [TT]    Clinical Course User Index [TT] Waymond Lorelle Cummins, MD     FINAL CLINICAL IMPRESSION(S) / ED DIAGNOSES   Final diagnoses:  Shortness of breath  Diabetic ketoacidosis without coma associated with other specified diabetes mellitus (HCC)  Pneumonia of left lung due to infectious organism, unspecified part of lung  Dehydration  Influenza A     Rx / DC Orders   ED Discharge Orders     None        Note:  This document was prepared using Dragon voice recognition software and may include unintentional dictation errors.    Waymond Lorelle Cummins, MD 02/12/24 2226

## 2024-02-12 NOTE — ED Triage Notes (Signed)
 Pt presents to the ED via POV from home with [facility], nausea, and no appetite. Pt was seen here on 12/16 and dx with the flu. Reports continued sx's.

## 2024-02-13 DIAGNOSIS — J189 Pneumonia, unspecified organism: Secondary | ICD-10-CM | POA: Diagnosis not present

## 2024-02-13 LAB — CBC WITH DIFFERENTIAL/PLATELET
Abs Immature Granulocytes: 0.21 K/uL — ABNORMAL HIGH (ref 0.00–0.07)
Basophils Absolute: 0 K/uL (ref 0.0–0.1)
Basophils Relative: 0 %
Eosinophils Absolute: 0 K/uL (ref 0.0–0.5)
Eosinophils Relative: 0 %
HCT: 44.1 % (ref 39.0–52.0)
Hemoglobin: 15.3 g/dL (ref 13.0–17.0)
Immature Granulocytes: 2 %
Lymphocytes Relative: 5 %
Lymphs Abs: 0.6 K/uL — ABNORMAL LOW (ref 0.7–4.0)
MCH: 29.4 pg (ref 26.0–34.0)
MCHC: 34.7 g/dL (ref 30.0–36.0)
MCV: 84.6 fL (ref 80.0–100.0)
Monocytes Absolute: 1 K/uL (ref 0.1–1.0)
Monocytes Relative: 9 %
Neutro Abs: 10 K/uL — ABNORMAL HIGH (ref 1.7–7.7)
Neutrophils Relative %: 84 %
Platelets: 385 K/uL (ref 150–400)
RBC: 5.21 MIL/uL (ref 4.22–5.81)
RDW: 13.7 % (ref 11.5–15.5)
WBC: 12 K/uL — ABNORMAL HIGH (ref 4.0–10.5)
nRBC: 0 % (ref 0.0–0.2)

## 2024-02-13 LAB — BETA-HYDROXYBUTYRIC ACID
Beta-Hydroxybutyric Acid: 5.04 mmol/L — ABNORMAL HIGH (ref 0.05–0.27)
Beta-Hydroxybutyric Acid: 0.89 mmol/L — ABNORMAL HIGH (ref 0.05–0.27)
Beta-Hydroxybutyric Acid: 0.14 mmol/L (ref 0.05–0.27)
Beta-Hydroxybutyric Acid: 0.22 mmol/L (ref 0.05–0.27)
Beta-Hydroxybutyric Acid: 1.79 mmol/L — ABNORMAL HIGH (ref 0.05–0.27)

## 2024-02-13 LAB — GLUCOSE, CAPILLARY
Glucose-Capillary: 228 mg/dL — ABNORMAL HIGH (ref 70–99)
Glucose-Capillary: 184 mg/dL — ABNORMAL HIGH (ref 70–99)
Glucose-Capillary: 156 mg/dL — ABNORMAL HIGH (ref 70–99)
Glucose-Capillary: 152 mg/dL — ABNORMAL HIGH (ref 70–99)
Glucose-Capillary: 131 mg/dL — ABNORMAL HIGH (ref 70–99)
Glucose-Capillary: 135 mg/dL — ABNORMAL HIGH (ref 70–99)
Glucose-Capillary: 244 mg/dL — ABNORMAL HIGH (ref 70–99)
Glucose-Capillary: 118 mg/dL — ABNORMAL HIGH (ref 70–99)
Glucose-Capillary: 234 mg/dL — ABNORMAL HIGH (ref 70–99)
Glucose-Capillary: 417 mg/dL — ABNORMAL HIGH (ref 70–99)
Glucose-Capillary: 291 mg/dL — ABNORMAL HIGH (ref 70–99)

## 2024-02-13 LAB — BASIC METABOLIC PANEL WITH GFR
Anion gap: 12 (ref 5–15)
Anion gap: 12 (ref 5–15)
Anion gap: 14 (ref 5–15)
Anion gap: 16 — ABNORMAL HIGH (ref 5–15)
Anion gap: 20 — ABNORMAL HIGH (ref 5–15)
BUN: 16 mg/dL (ref 8–23)
BUN: 16 mg/dL (ref 8–23)
BUN: 18 mg/dL (ref 8–23)
BUN: 18 mg/dL (ref 8–23)
BUN: 21 mg/dL (ref 8–23)
CO2: 15 mmol/L — ABNORMAL LOW (ref 22–32)
CO2: 16 mmol/L — ABNORMAL LOW (ref 22–32)
CO2: 18 mmol/L — ABNORMAL LOW (ref 22–32)
CO2: 19 mmol/L — ABNORMAL LOW (ref 22–32)
CO2: 20 mmol/L — ABNORMAL LOW (ref 22–32)
Calcium: 7.9 mg/dL — ABNORMAL LOW (ref 8.9–10.3)
Calcium: 7.9 mg/dL — ABNORMAL LOW (ref 8.9–10.3)
Calcium: 7.9 mg/dL — ABNORMAL LOW (ref 8.9–10.3)
Calcium: 8 mg/dL — ABNORMAL LOW (ref 8.9–10.3)
Calcium: 8 mg/dL — ABNORMAL LOW (ref 8.9–10.3)
Chloride: 102 mmol/L (ref 98–111)
Chloride: 103 mmol/L (ref 98–111)
Chloride: 105 mmol/L (ref 98–111)
Chloride: 106 mmol/L (ref 98–111)
Chloride: 99 mmol/L (ref 98–111)
Creatinine, Ser: 0.87 mg/dL (ref 0.61–1.24)
Creatinine, Ser: 0.9 mg/dL (ref 0.61–1.24)
Creatinine, Ser: 0.94 mg/dL (ref 0.61–1.24)
Creatinine, Ser: 0.96 mg/dL (ref 0.61–1.24)
Creatinine, Ser: 0.98 mg/dL (ref 0.61–1.24)
GFR, Estimated: >60 mL/min
GFR, Estimated: >60 mL/min
GFR, Estimated: >60 mL/min
GFR, Estimated: >60 mL/min
GFR, Estimated: >60 mL/min
Glucose, Bld: 111 mg/dL — ABNORMAL HIGH (ref 70–99)
Glucose, Bld: 134 mg/dL — ABNORMAL HIGH (ref 70–99)
Glucose, Bld: 247 mg/dL — ABNORMAL HIGH (ref 70–99)
Glucose, Bld: 272 mg/dL — ABNORMAL HIGH (ref 70–99)
Glucose, Bld: 424 mg/dL — ABNORMAL HIGH (ref 70–99)
Potassium: 3.8 mmol/L (ref 3.5–5.1)
Potassium: 4.1 mmol/L (ref 3.5–5.1)
Potassium: 4.2 mmol/L (ref 3.5–5.1)
Potassium: 4.3 mmol/L (ref 3.5–5.1)
Potassium: 4.9 mmol/L (ref 3.5–5.1)
Sodium: 133 mmol/L — ABNORMAL LOW (ref 135–145)
Sodium: 134 mmol/L — ABNORMAL LOW (ref 135–145)
Sodium: 135 mmol/L (ref 135–145)
Sodium: 136 mmol/L (ref 135–145)
Sodium: 137 mmol/L (ref 135–145)

## 2024-02-13 LAB — HIV ANTIBODY (ROUTINE TESTING W REFLEX): HIV Screen 4th Generation wRfx: NONREACTIVE

## 2024-02-13 LAB — MRSA NEXT GEN BY PCR, NASAL: MRSA by PCR Next Gen: NOT DETECTED

## 2024-02-13 LAB — CBG MONITORING, ED: Glucose-Capillary: 241 mg/dL — ABNORMAL HIGH (ref 70–99)

## 2024-02-13 LAB — MAGNESIUM: Magnesium: 2.2 mg/dL (ref 1.7–2.4)

## 2024-02-13 LAB — HEMOGLOBIN A1C
Hgb A1c MFr Bld: 8.1 % — ABNORMAL HIGH (ref 4.8–5.6)
Mean Plasma Glucose: 185.77 mg/dL

## 2024-02-13 LAB — PHOSPHORUS: Phosphorus: 1.6 mg/dL — ABNORMAL LOW (ref 2.5–4.6)

## 2024-02-13 MED ORDER — ASPIRIN 81 MG PO TBEC
81.0000 mg | DELAYED_RELEASE_TABLET | Freq: Every day | ORAL | Status: DC
  Administered 2024-02-13 – 2024-02-14 (×2): 81 mg via ORAL
  Filled 2024-02-13 (×2): qty 1

## 2024-02-13 MED ORDER — IPRATROPIUM-ALBUTEROL 0.5-2.5 (3) MG/3ML IN SOLN: 3.0000 mL | Freq: Three times a day (TID) | RESPIRATORY_TRACT | Status: DC

## 2024-02-13 MED ORDER — IPRATROPIUM-ALBUTEROL 0.5-2.5 (3) MG/3ML IN SOLN
3.0000 mL | RESPIRATORY_TRACT | Status: DC
  Administered 2024-02-13: 3 mL via RESPIRATORY_TRACT
  Filled 2024-02-13: qty 3

## 2024-02-13 MED ORDER — INSULIN ASPART 100 UNIT/ML IJ SOLN
5.0000 [IU] | Freq: Three times a day (TID) | INTRAMUSCULAR | Status: DC
  Administered 2024-02-13 – 2024-02-14 (×2): 5 [IU] via SUBCUTANEOUS
  Filled 2024-02-13: qty 5

## 2024-02-13 MED ORDER — INSULIN ASPART 100 UNIT/ML IJ SOLN
0.0000 [IU] | Freq: Three times a day (TID) | INTRAMUSCULAR | Status: DC
  Administered 2024-02-13: 3 [IU] via SUBCUTANEOUS
  Filled 2024-02-13: qty 3
  Filled 2024-02-13: qty 9

## 2024-02-13 MED ORDER — EZETIMIBE 10 MG PO TABS
10.0000 mg | ORAL_TABLET | Freq: Every day | ORAL | Status: DC
  Administered 2024-02-13 – 2024-02-14 (×2): 10 mg via ORAL
  Filled 2024-02-13 (×2): qty 1

## 2024-02-13 MED ORDER — DEXAMETHASONE SOD PHOSPHATE PF 10 MG/ML IJ SOLN
10.0000 mg | Freq: Once | INTRAMUSCULAR | Status: AC
  Administered 2024-02-13: 10 mg via INTRAVENOUS
  Filled 2024-02-13: qty 1

## 2024-02-13 MED ORDER — MAGNESIUM GLUCONATE 500 (27 MG) MG PO TABS
500.0000 mg | ORAL_TABLET | Freq: Once | ORAL | Status: AC
  Administered 2024-02-13: 500 mg via ORAL
  Filled 2024-02-13: qty 1

## 2024-02-13 MED ORDER — INSULIN GLARGINE 100 UNIT/ML ~~LOC~~ SOLN
20.0000 [IU] | Freq: Every day | SUBCUTANEOUS | Status: DC
  Administered 2024-02-13: 20 [IU] via SUBCUTANEOUS
  Filled 2024-02-13: qty 0.2

## 2024-02-13 MED ORDER — INSULIN GLARGINE-YFGN 100 UNIT/ML ~~LOC~~ SOPN
10.0000 [IU] | PEN_INJECTOR | Freq: Once | SUBCUTANEOUS | Status: DC
  Filled 2024-02-13: qty 3

## 2024-02-13 MED ORDER — VITAMIN B-12 1000 MCG PO TABS
1000.0000 ug | ORAL_TABLET | Freq: Every day | ORAL | Status: DC
  Administered 2024-02-13 – 2024-02-14 (×2): 1000 ug via ORAL
  Filled 2024-02-13 (×2): qty 1

## 2024-02-13 MED ORDER — INSULIN ASPART 100 UNIT/ML IJ SOLN
0.0000 [IU] | Freq: Every day | INTRAMUSCULAR | Status: DC
  Administered 2024-02-13: 3 [IU] via SUBCUTANEOUS
  Filled 2024-02-13: qty 3

## 2024-02-13 MED ORDER — IPRATROPIUM-ALBUTEROL 0.5-2.5 (3) MG/3ML IN SOLN
3.0000 mL | Freq: Two times a day (BID) | RESPIRATORY_TRACT | Status: DC
  Administered 2024-02-13 – 2024-02-14 (×2): 3 mL via RESPIRATORY_TRACT
  Filled 2024-02-13 (×2): qty 3

## 2024-02-13 MED ORDER — INSULIN GLARGINE 100 UNIT/ML ~~LOC~~ SOLN
10.0000 [IU] | Freq: Once | SUBCUTANEOUS | Status: AC
  Administered 2024-02-13: 10 [IU] via SUBCUTANEOUS
  Filled 2024-02-13: qty 0.1

## 2024-02-13 MED ORDER — DEXTROSE 50 % IV SOLN: 1.0000 | INTRAVENOUS | Status: DC | PRN

## 2024-02-13 MED ORDER — ORAL CARE MOUTH RINSE: 15.0000 mL | OROMUCOSAL | Status: DC | PRN

## 2024-02-13 MED ORDER — DOXYCYCLINE HYCLATE 100 MG PO TABS
100.0000 mg | ORAL_TABLET | Freq: Two times a day (BID) | ORAL | Status: DC
  Administered 2024-02-14: 100 mg via ORAL
  Filled 2024-02-13: qty 1

## 2024-02-13 MED ORDER — INSULIN ASPART 100 UNIT/ML IJ SOLN
0.0000 [IU] | Freq: Three times a day (TID) | INTRAMUSCULAR | Status: DC
  Administered 2024-02-14: 5 [IU] via SUBCUTANEOUS
  Filled 2024-02-13: qty 5

## 2024-02-13 MED ORDER — FAMOTIDINE 20 MG PO TABS
20.0000 mg | ORAL_TABLET | Freq: Two times a day (BID) | ORAL | Status: DC
  Administered 2024-02-13 – 2024-02-14 (×3): 20 mg via ORAL
  Filled 2024-02-13 (×3): qty 1

## 2024-02-13 MED ORDER — INSULIN ASPART 100 UNIT/ML IJ SOLN
10.0000 [IU] | Freq: Once | INTRAMUSCULAR | Status: AC
  Administered 2024-02-13: 10 [IU] via SUBCUTANEOUS
  Filled 2024-02-13: qty 10

## 2024-02-13 MED ORDER — INSULIN GLARGINE-YFGN 100 UNIT/ML ~~LOC~~ SOPN
5.0000 [IU] | PEN_INJECTOR | Freq: Once | SUBCUTANEOUS | Status: DC
  Filled 2024-02-13: qty 3

## 2024-02-13 NOTE — Progress Notes (Addendum)
 " PROGRESS NOTE    Joseph Mcgee   FMW:[id number] DOB: 1955/03/02  DOA: 02/12/2024 Date of Service: 02/13/2024 which is hospital day 1  PCP: Alla Amis, MD    Hospital course / significant events:   HPI: Joseph Mcgee is a 67 y.o. 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  The patient was diagnosed with influenza A on 02/05/2024 and since then he has had a decline  12/23: to ED. In the ER, weak appearing, tachycardic and tachypneic, influenza A+.  Chest x-[place] showing mild patchy opacity at the left lung base, atelectasis versus pneumonia.  Due to concern for influenza A infection and superimposed bacterial pulmonary infection, the patient was started on Rocephin  and IV doxycycline  in the ER. Serum glucose of 228, serum bicarb 16, anion gap 23, beta hydroxybutyrate acid 5.12 - concern for euglycemic DKA. Admitted to hospitalist service on abx, insulin  infusion,  12/24: AG closed this morning, off insulin  gtt, remains on room air. Hyperglycemic this afternoon, increasing mealtime and basal insulin      Consultants:  none  Procedures/Surgeries: none      ASSESSMENT & PLAN:   Left lower lobe pneumonia, in the setting of influenza A viral infection, POA Rocephin  and IV doxycycline , plan abx x 5-7 days total  Continue Tamiflu  75 mg twice daily x 5 days (limited utility given tested (+)Influena A 12/16/25but will leave on this medication)  Maintain O2 saturation above 92% Decadron  10 mg x 1 given  Continue bronchodilator nebulizers every 4 hours As needed antitussives Incentive spirometer Early mobilization  Anion gap metabolic acidosis  Euglycemic DKA type II Type 2 diabetes, insulin  dependent Recent A1C outpatient 11/29/2023 7.0 = at goal   Hold home meds Jardiance, metformin, Ozempic Pt states home insulin  at meal times is rare as he is not much of an appetite - started here w/ lower dose  SSI here and lower dose long-acting, titrate upward / as needed based on Glc  Follow hemoglobin A1c. Diabetes coordinator    Initially NO tachycardia/tachypnea but became so 02/13/24 00:45 and met sepsis/SIRS criteria. Leukocytosis 13.4, left lower lobe infiltrates on chest x-[place] suggestive of pneumonia. Question sepsis d/t PNA or SIRS d/t DKA  Treating underlying cause(s) as noted   Hypertension Hold off home oral antihypertensives hydrochlorothiazide, losartan due to soft BPs Closely monitor vital signs.   Hyperlipidemia Statin intolerance home Zetia    Generalized weakness PT OT evaluation Fall precaution Early mobilization. Encourage increase in oral protein calorie intake.        overweigh based on BMI: Body mass index is 27.47 kg/m.SABRA Significantly low or high BMI is associated with higher medical risk.  Underweight - under 18  overweight - 25 to 29 obese - 30 or more Class 1 obesity: BMI of 30.0 to 34 Class 2 obesity: BMI of 35.0 to 39 Class 3 obesity: BMI of 40.0 to 49 Super Morbid Obesity: BMI 50-59 Super-super Morbid Obesity: BMI 60+ Healthy nutrition and physical activity advised as adjunct to other disease management and risk reduction treatments    DVT prophylaxis: lovenox  IV fluids: no continuous IV fluids  Nutrition: carb modified Central lines / other devices: none  Code Status: FULL CODE ACP documentation reviewed: none on file in [facility]  TOC needs: home health  Medical barriers to dispo: Glc stabilization, IV abx  pend cultures. Expected medical readiness for discharge tomorrow / day after.              Subjective / Brief ROS:  Patient reports feeling tired today but overall beter compares to initial presentation to the ED  Denies CP/SOB.  Pain controlled.  Denies new weakness.   Tolerating diet.  Reports no concerns w/ urination/defecation.   Family Communication: none at bedside     Objective Findings:  Vitals:   02/13/24 1100 02/13/24 1200 02/13/24 1436 02/13/24 1545  BP: 133/78 126/71 111/78 132/76  Pulse: 88 84 90 91  Resp:   18 18  Temp:   97.8 F (36.6 C) 97.7 F (36.5 C)  TempSrc:      SpO2: 96% 94% 96% 96%  Weight:      Height:        Intake/Output Summary (Last 24 hours) at 02/13/2024 1626 Last data filed at 02/13/2024 1300 Gross per 24 hour  Intake 2514.21 ml  Output 1225 ml  Net 1289.21 ml   Filed Weights   02/12/24 1552  Weight: 84.4 kg    Examination:  Physical Exam Constitutional:      General: He is not in acute distress. Cardiovascular:     Rate and Rhythm: Normal rate and regular rhythm.  Pulmonary:     Effort: Pulmonary effort is normal.     Breath sounds: Wheezing present. No decreased breath sounds, rhonchi or rales.  Skin:    General: Skin is warm and dry.  Neurological:     Mental Status: He is alert and oriented to person, place, and time.  Psychiatric:        Mood and Affect: Mood normal.        Behavior: Behavior normal.          Scheduled Medications:   aspirin  EC  81 mg Oral Daily   cyanocobalamin   1,000 mcg Oral Daily   [START ON 02/14/2024] doxycycline   100 mg Oral Q12H   enoxaparin  (LOVENOX ) injection  40 mg Subcutaneous Q24H   ezetimibe   10 mg Oral Daily   famotidine   20 mg Oral BID   [START ON 02/14/2024] insulin  aspart  0-15 Units Subcutaneous TID WC   insulin  aspart  0-5 Units Subcutaneous QHS   insulin  aspart  10 Units Subcutaneous Once   insulin  aspart  5 Units Subcutaneous TID WC   insulin  glargine  20 Units Subcutaneous Q2200   ipratropium-albuterol   3 mL Nebulization BID   oseltamivir   75 mg Oral BID    Continuous Infusions:  cefTRIAXone  (ROCEPHIN )  IV     doxycycline  (VIBRAMYCIN ) IV Stopped (02/13/24 1029)    PRN Medications:  acetaminophen , dextrose , melatonin, mouth  rinse, polyethylene glycol, prochlorperazine   Antimicrobials from admission:  Anti-infectives (From admission, onward)    Start     Dose/Rate Route Frequency Ordered Stop   02/14/24 1000  doxycycline  (VIBRA -TABS) tablet 100 mg        100 mg Oral Every 12 hours 02/13/24 1033     02/13/24 1800  cefTRIAXone  (ROCEPHIN ) 2 g in sodium chloride  0.9 % 100 mL IVPB        2 g 200 mL/hr over 30 Minutes Intravenous Every 24 hours 02/12/24 2236     02/13/24 0800  doxycycline  (VIBRAMYCIN ) 100 mg in sodium chloride  0.9 % 250 mL IVPB        100 mg 125 mL/hr over 120 Minutes Intravenous Every 12 hours 02/12/24 2236 02/13/24 2359   02/12/24 2245  oseltamivir  (TAMIFLU )  capsule 75 mg        75 mg Oral 2 times daily 02/12/24 2235 02/17/24 2159   02/12/24 2000  cefTRIAXone  (ROCEPHIN ) 2 g in sodium chloride  0.9 % 100 mL IVPB        2 g 200 mL/hr over 30 Minutes Intravenous  Once 02/12/24 1945 02/12/24 2030   02/12/24 2000  doxycycline  (VIBRAMYCIN ) 100 mg in sodium chloride  0.9 % 250 mL IVPB        100 mg 125 mL/hr over 120 Minutes Intravenous  Once 02/12/24 1945 02/12/24 2250           Data Reviewed:  I have personally reviewed the following...  CBC: Recent Labs  Lab 02/12/24 1556 02/13/24 0827  WBC 13.4* 12.0*  NEUTROABS  --  10.0*  HGB 17.3* 15.3  HCT 51.0 44.1  MCV 86.9 84.6  PLT 446* 385   Basic Metabolic Panel: Recent Labs  Lab 02/12/24 1556 02/12/24 2345 02/13/24 0339 02/13/24 0827 02/13/24 1055  NA 133* 135 137 136 134*  K 4.1 4.3 4.1 3.8 4.2  CL 94* 99 103 106 105  CO2 16* 16* 20* 19* 18*  GLUCOSE 228* 272* 134* 111* 247*  BUN 22 21 18 16 16   CREATININE 1.01 0.96 0.87 0.90 0.94  CALCIUM 8.7* 7.9* 8.0* 7.9* 7.9*  MG  --   --   --  2.2  --   PHOS  --   --   --  1.6*  --    GFR: Estimated Creatinine Clearance: 75.2 mL/min (by C-G formula based on SCr of 0.94 mg/dL). Liver Function Tests: No results for input(s): AST, ALT, ALKPHOS, BILITOT, PROT, ALBUMIN in  the last 168 hours. No results for input(s): LIPASE, AMYLASE in the last 168 hours. No results for input(s): AMMONIA in the last 168 hours. Coagulation Profile: No results for input(s): INR, PROTIME in the last 168 hours. Cardiac Enzymes: No results for input(s): CKTOTAL, CKMB, CKMBINDEX, TROPONINI in the last 168 hours. BNP (last 3 results) Recent Labs    02/12/24 1950  PROBNP 244.0   HbA1C: Recent Labs    02/12/24 2345  HGBA1C 8.1*   CBG: Recent Labs  Lab 02/13/24 0539 02/13/24 0641 02/13/24 0843 02/13/24 1050 02/13/24 1618  GLUCAP 131* 135* 118* 234* 417*   Lipid Profile: No results for input(s): CHOL, HDL, LDLCALC, TRIG, CHOLHDL, LDLDIRECT in the last 72 hours. Thyroid Function Tests: No results for input(s): TSH, T4TOTAL, FREET4, T3FREE, THYROIDAB in the last 72 hours. Anemia Panel: No results for input(s): VITAMINB12, FOLATE, FERRITIN, TIBC, IRON, RETICCTPCT in the last 72 hours. Most Recent Urinalysis On File:     Component Value Date/Time   COLORURINE YELLOW (A) 02/12/2024 2158   APPEARANCEUR CLEAR (A) 02/12/2024 2158   APPEARANCEUR Slightly cloudy 10/30/2023 1135   LABSPEC 1.021 02/12/2024 2158   PHURINE 5.0 02/12/2024 2158   GLUCOSEU >=500 (A) 02/12/2024 2158   HGBUR SMALL (A) 02/12/2024 2158   BILIRUBINUR NEGATIVE 02/12/2024 2158   BILIRUBINUR Negative 10/30/2023 1135   KETONESUR 20 (A) 02/12/2024 2158   PROTEINUR NEGATIVE 02/12/2024 2158   NITRITE NEGATIVE 02/12/2024 2158   LEUKOCYTESUR NEGATIVE 02/12/2024 2158   Sepsis Labs: @LABRCNTIP (procalcitonin:4,lacticidven:4) Microbiology: Recent Results (from the past 240 hours)  Resp panel by RT-PCR (RSV, Flu A&B, Covid) Anterior Nasal Swab     Status: Abnormal   Collection Time: 02/05/24  4:18 PM   Specimen: Anterior Nasal Swab  Result Value Ref Range Status   SARS Coronavirus 2 by RT PCR  NEGATIVE NEGATIVE Final    Comment: (NOTE) SARS-CoV-2 target  nucleic acids are NOT DETECTED.  The SARS-CoV-2 RNA is generally detectable in upper respiratory specimens during the acute phase of infection. The lowest concentration of SARS-CoV-2 viral copies this assay can detect is 138 copies/mL. A negative result does not preclude SARS-Cov-2 infection and should not be used as the sole basis for treatment or other patient management decisions. A negative result may occur with  improper specimen collection/handling, submission of specimen other than nasopharyngeal swab, presence of viral mutation(s) within the areas targeted by this assay, and inadequate number of viral copies(<138 copies/mL). A negative result must be combined with clinical observations, patient history, and epidemiological information. The expected result is Negative.  Fact Sheet for Patients:  bloggercourse.com  Fact Sheet for Healthcare Providers:  seriousbroker.it  This test is no t yet approved or cleared by the [place]  FDA and  has been authorized for detection and/or diagnosis of SARS-CoV-2 by FDA under an Emergency Use Authorization (EUA). This EUA will remain  in effect (meaning this test can be used) for the duration of the COVID-19 declaration under Section 564(b)(1) of the Act, 21 U.S.C.section 360bbb-3(b)(1), unless the authorization is terminated  or revoked sooner.       Influenza A by PCR POSITIVE (A) NEGATIVE Final   Influenza B by PCR NEGATIVE NEGATIVE Final    Comment: (NOTE) The Xpert Xpress SARS-CoV-2/FLU/RSV plus assay is intended as an aid in the diagnosis of influenza from Nasopharyngeal swab specimens and should not be used as a sole basis for treatment. Nasal washings and aspirates are unacceptable for Xpert Xpress SARS-CoV-2/FLU/RSV testing.  Fact Sheet for Patients: bloggercourse.com  Fact Sheet for Healthcare  Providers: seriousbroker.it  This test is not yet approved or cleared by the [place]  FDA and has been authorized for detection and/or diagnosis of SARS-CoV-2 by FDA under an Emergency Use Authorization (EUA). This EUA will remain in effect (meaning this test can be used) for the duration of the COVID-19 declaration under Section 564(b)(1) of the Act, 21 U.S.C. section 360bbb-3(b)(1), unless the authorization is terminated or revoked.     Resp Syncytial Virus by PCR NEGATIVE NEGATIVE Final    Comment: (NOTE) Fact Sheet for Patients: bloggercourse.com  Fact Sheet for Healthcare Providers: seriousbroker.it  This test is not yet approved or cleared by the [place]  FDA and has been authorized for detection and/or diagnosis of SARS-CoV-2 by FDA under an Emergency Use Authorization (EUA). This EUA will remain in effect (meaning this test can be used) for the duration of the COVID-19 declaration under Section 564(b)(1) of the Act, 21 U.S.C. section 360bbb-3(b)(1), unless the authorization is terminated or revoked.  Performed at [facility], [address] [place], [place] [address]   Resp panel by RT-PCR (RSV, Flu A&B, Covid) Anterior Nasal Swab     Status: Abnormal   Collection Time: 02/12/24  7:50 PM   Specimen: Anterior Nasal Swab  Result Value Ref Range Status   SARS Coronavirus 2 by RT PCR NEGATIVE NEGATIVE Final    Comment: (NOTE) SARS-CoV-2 target nucleic acids are NOT DETECTED.  The SARS-CoV-2 RNA is generally detectable in upper respiratory specimens during the acute phase of infection. The lowest concentration of SARS-CoV-2 viral copies this assay can detect is 138 copies/mL. A negative result does not preclude SARS-Cov-2 infection and should not be used as the sole basis for treatment or other patient management decisions. A negative result may occur with  improper specimen  collection/handling, submission of specimen other than nasopharyngeal swab, presence of viral mutation(s) within the areas targeted by this assay, and inadequate number of viral copies(<138 copies/mL). A negative result must be combined with clinical observations, patient history, and epidemiological information. The expected result is Negative.  Fact Sheet for Patients:  bloggercourse.com  Fact Sheet for Healthcare Providers:  seriousbroker.it  This test is no t yet approved or cleared by the [place]  FDA and  has been authorized for detection and/or diagnosis of SARS-CoV-2 by FDA under an Emergency Use Authorization (EUA). This EUA will remain  in effect (meaning this test can be used) for the duration of the COVID-19 declaration under Section 564(b)(1) of the Act, 21 U.S.C.section 360bbb-3(b)(1), unless the authorization is terminated  or revoked sooner.       Influenza A by PCR POSITIVE (A) NEGATIVE Final   Influenza B by PCR NEGATIVE NEGATIVE Final    Comment: (NOTE) The Xpert Xpress SARS-CoV-2/FLU/RSV plus assay is intended as an aid in the diagnosis of influenza from Nasopharyngeal swab specimens and should not be used as a sole basis for treatment. Nasal washings and aspirates are unacceptable for Xpert Xpress SARS-CoV-2/FLU/RSV testing.  Fact Sheet for Patients: bloggercourse.com  Fact Sheet for Healthcare Providers: seriousbroker.it  This test is not yet approved or cleared by the [place]  FDA and has been authorized for detection and/or diagnosis of SARS-CoV-2 by FDA under an Emergency Use Authorization (EUA). This EUA will remain in effect (meaning this test can be used) for the duration of the COVID-19 declaration under Section 564(b)(1) of the Act, 21 U.S.C. section 360bbb-3(b)(1), unless the authorization is terminated or revoked.     Resp Syncytial  Virus by PCR NEGATIVE NEGATIVE Final    Comment: (NOTE) Fact Sheet for Patients: bloggercourse.com  Fact Sheet for Healthcare Providers: seriousbroker.it  This test is not yet approved or cleared by the [place]  FDA and has been authorized for detection and/or diagnosis of SARS-CoV-2 by FDA under an Emergency Use Authorization (EUA). This EUA will remain in effect (meaning this test can be used) for the duration of the COVID-19 declaration under Section 564(b)(1) of the Act, 21 U.S.C. section 360bbb-3(b)(1), unless the authorization is terminated or revoked.  Performed at [facility], [address] [place], [place] [address]   MRSA Next Gen by PCR, Nasal     Status: None   Collection Time: 02/13/24  1:03 AM   Specimen: Nasal Mucosa; Nasal Swab  Result Value Ref Range Status   MRSA by PCR Next Gen NOT DETECTED NOT DETECTED Final    Comment: (NOTE) The GeneXpert MRSA Assay (FDA approved for NASAL specimens only), is one component of a comprehensive MRSA colonization surveillance program. It is not intended to diagnose MRSA infection nor to guide or monitor treatment for MRSA infections. Test performance is not FDA approved in patients less than 55 years old. Performed at [facility], [address] [place], [place] [address]       Radiology Studies last 3 days: DG Chest 2 View Result Date: 02/12/2024 CLINICAL DATA:  Shortness of breath. EXAM: CHEST - 2 VIEW COMPARISON:  04/20/2021 FINDINGS: Normal heart size with stable mediastinal contours. Mild patchy opacity at the left lung base. No pulmonary edema, pleural effusion, or pneumothorax. On limited assessment, no acute osseous findings. IMPRESSION: Mild patchy opacity at the left lung base, atelectasis versus pneumonia. Electronically Signed   By: Andrea Gasman M.D.   On: 02/12/2024 17:39  Tangee Marszalek, DO [facility] 02/13/2024, 4:26 PM    Dictation software may have been used to generate the above note. Typos may occur and escape review in typed/dictated notes. Please contact Dr Marsa directly for clarity if needed.  Staff may message me via secure chat in Epic  but this may not receive an immediate response,  please page me for urgent matters!  If 7PM-7AM, please contact night coverage [email or web address]       "

## 2024-02-13 NOTE — Hospital Course (Addendum)
 Hospital course / significant events:   HPI: Joseph Mcgee is a 68 y.o. 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  The patient was diagnosed with influenza A on 02/05/2024 and since then he has had a decline  12/23: to ED. In the ER, weak appearing, tachycardic and tachypneic, influenza A+.  Chest x-[place] showing mild patchy opacity at the left lung base, atelectasis versus pneumonia.  Due to concern for influenza A infection and superimposed bacterial pulmonary infection, the patient was started on Rocephin  and IV doxycycline  in the ER. Serum glucose of 228, serum bicarb 16, anion gap 23, beta hydroxybutyrate acid 5.12 - concern for euglycemic DKA. Admitted to hospitalist service on abx, insulin  infusion,  12/24: AG closed this morning, off insulin  gtt, remains on room air. Hyperglycemic this afternoon, increasing mealtime and basal insulin  12/25: ***     Consultants:  none  Procedures/Surgeries: none      ASSESSMENT & PLAN:   Left lower lobe pneumonia, in the setting of influenza A viral infection, POA Rocephin  and IV doxycycline , plan abx x 5-7 days total  Continue Tamiflu  75 mg twice daily x 5 days (limited utility given tested (+)Influena A 12/16/25but will leave on this medication)  Maintain O2 saturation above 92% Decadron  10 mg x 1 given  Continue bronchodilator nebulizers every 4 hours As needed antitussives Incentive spirometer Early mobilization  Anion gap metabolic acidosis  Euglycemic DKA type II Type 2 diabetes, insulin  dependent Recent A1C outpatient 11/29/2023 7.0 = at goal  Hold home meds Jardiance, metformin, Ozempic Pt states home insulin  at meal times is rare as he is not much of an appetite - started here w/ lower dose  SSI here  and lower dose long-acting, titrate upward / as needed based on Glc  Follow hemoglobin A1c. Diabetes coordinator    Initially NO tachycardia/tachypnea but became so 02/13/24 00:45 and met sepsis/SIRS criteria. Leukocytosis 13.4, left lower lobe infiltrates on chest x-[place] suggestive of pneumonia. Question sepsis d/t PNA or SIRS d/t DKA  Treating underlying cause(s) as noted   Hypertension Hold off home oral antihypertensives hydrochlorothiazide, losartan due to soft BPs Closely monitor vital signs.   Hyperlipidemia Statin intolerance home Zetia    Generalized weakness PT OT evaluation Fall precaution Early mobilization. Encourage increase in oral protein calorie intake.        overweigh based on BMI: Body mass index is 27.47 kg/m.SABRA Significantly low or high BMI is associated with higher medical risk.  Underweight - under 18  overweight - 25 to 29 obese - 30 or more Class 1 obesity: BMI of 30.0 to 34 Class 2 obesity: BMI of 35.0 to 39 Class 3 obesity: BMI of 40.0 to 49 Super Morbid Obesity: BMI 50-59 Super-super Morbid Obesity: BMI 60+ Healthy nutrition and physical activity advised as adjunct to other disease management and risk reduction treatments    DVT prophylaxis: lovenox  IV fluids: no continuous IV fluids  Nutrition: carb modified Central lines / other devices: none  Code Status: FULL CODE ACP documentation reviewed: none on file in [facility]  TOC needs: home health  Medical barriers to dispo: Glc stabilization, IV abx pend cultures. Expected medical readiness for discharge tomorrow / day after.

## 2024-02-13 NOTE — Evaluation (Signed)
 Physical Therapy Evaluation Patient Details Name: RINO HOSEA MRN: [id number] DOB: 05/30/1955 Today's Date: 02/13/2024  History of Present Illness  Patient is a 68 year old male with Shortness of breath, diarrhea, poor oral intake, and cough. Flu +. 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  Clinical Impression  Patient is agreeable to PT evaluation. He is independent at baseline and lives with his spouse.  Today the patient is generally weak compared to his baseline. He feels fatigued with minimal activity. Assistance required for short distance walking in the room. Recommend to continue PT to maximize independence and facilitate return to prior level of function.       If plan is discharge home, recommend the following: A little help with walking and/or transfers;A little help with bathing/dressing/bathroom;Assist for transportation;Help with stairs or ramp for entrance   Can travel by private vehicle        Equipment Recommendations None recommended by PT  Recommendations for Other Services       Functional Status Assessment Patient has had a recent decline in their functional status and demonstrates the ability to make significant improvements in function in a reasonable and predictable amount of time.     Precautions / Restrictions Precautions Precautions: Fall Recall of Precautions/Restrictions: Intact Restrictions Weight Bearing Restrictions Per Provider Order: No      Mobility  Bed Mobility Overal bed mobility: Needs Assistance Bed Mobility: Supine to Sit     Supine to sit: Min assist          Transfers Overall transfer level: Needs assistance Equipment used: 2 person hand held assist Transfers: Sit to/from Stand Sit to Stand: Min assist, +2 physical assistance           General transfer comment: steadying assistance required for standing from bed  and bed side commode    Ambulation/Gait Ambulation/Gait assistance: Min assist, +2 safety/equipment Gait Distance (Feet): 25 Feet Assistive device: 1 person hand held assist Gait Pattern/deviations: Step-through pattern, Decreased stride length, Narrow base of support Gait velocity: decreased     General Gait Details: offered rolling walker however patient declined. slow gait pattern and fatigue with activity. dyspnea with exertion  Stairs            Wheelchair Mobility     Tilt Bed    Modified Rankin (Stroke Patients Only)       Balance Overall balance assessment: Needs assistance Sitting-balance support: Feet supported Sitting balance-Leahy Scale: Fair     Standing balance support: Single extremity supported Standing balance-Leahy Scale: Fair                               Pertinent Vitals/Pain Pain Assessment Pain Assessment: Faces Faces Pain Scale: Hurts a little bit Pain Location: abdomen Pain Descriptors / Indicators: Discomfort Pain Intervention(s): Limited activity within patient's tolerance, Monitored during session, Repositioned    Home Living Family/patient expects to be discharged to:: Private residence Living Arrangements: Spouse/significant other Available Help at Discharge: Family;Available 24 hours/day Type of Home: House Home Access: Stairs to enter Entrance Stairs-Rails: None Entrance Stairs-Number of Steps: 2   Home Layout: One level Home Equipment: Shower seat      Prior Function Prior Level of Function : Independent/Modified Independent;Driving                     Extremity/Trunk Assessment   Upper Extremity Assessment  Upper Extremity Assessment: Defer to OT evaluation    Lower Extremity Assessment Lower Extremity Assessment: Generalized weakness       Communication   Communication Communication: No apparent difficulties    Cognition Arousal: Alert Behavior During Therapy: WFL for tasks  assessed/performed   PT - Cognitive impairments: No apparent impairments                         Following commands: Intact       Cueing Cueing Techniques: Verbal cues     General Comments General comments (skin integrity, edema, etc.): patient is generally weak compared to his baseline    Exercises     Assessment/Plan    PT Assessment Patient needs continued PT services  PT Problem List Decreased strength;Decreased range of motion;Decreased activity tolerance;Decreased balance;Decreased mobility       PT Treatment Interventions DME instruction;Gait training;Functional mobility training;Stair training;Therapeutic activities;Therapeutic exercise;Balance training;Neuromuscular re-education;Cognitive remediation;Patient/family education    PT Goals (Current goals can be found in the Care Plan section)  Acute Rehab PT Goals Patient Stated Goal: to feel better PT Goal Formulation: With patient Time For Goal Achievement: 02/27/24 Potential to Achieve Goals: Good    Frequency Min 1X/week     Co-evaluation               AM-PAC PT 6 Clicks Mobility  Outcome Measure Help needed turning from your back to your side while in a flat bed without using bedrails?: A Little Help needed moving from lying on your back to sitting on the side of a flat bed without using bedrails?: A Little Help needed moving to and from a bed to a chair (including a wheelchair)?: A Little Help needed standing up from a chair using your arms (e.g., wheelchair or bedside chair)?: A Little Help needed to walk in hospital room?: A Little Help needed climbing 3-5 steps with a railing? : A Little 6 Click Score: 18    End of Session   Activity Tolerance: Patient limited by fatigue Patient left: with call bell/phone within reach;with bed alarm set;in chair Nurse Communication: Mobility status PT Visit Diagnosis: Muscle weakness (generalized) (M62.81);Unsteadiness on feet (R26.81)    Time:  9075-9049 PT Time Calculation (min) (ACUTE ONLY): 26 min   Charges:   PT Evaluation $PT Eval Moderate Complexity: 1 Mod   PT General Charges $$ ACUTE PT VISIT: 1 Visit         Randine Essex, PT, MPT   Randine LULLA Essex 02/13/2024, 11:05 AM

## 2024-02-13 NOTE — Evaluation (Signed)
 Occupational Therapy Evaluation Patient Details Name: Joseph Mcgee MRN: [id number] DOB: Dec 09, 1955 Today's Date: 02/13/2024   History of Present Illness   Patient is a 68 year old male with Shortness of breath, diarrhea, poor oral intake, and cough. Flu +. 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     Clinical Impressions Patient presenting with decreased Ind in self care,balance, functional mobility, transfers, endurance, and safety awareness. Patient reports being Ind at baseline and living at home with wife. Pt needing min A for bed mobility./ Pt stands and reports needing to use bathroom secondary to bowel incontinence. Pt transfers to [facility] and able to void further. Pt changes brief with min A and performs hygiene while seated. Pt is very fatigued after performing self care tasks. Pt able to ambulate ~ 20' in room with min A of 2 secondary to manage pt for safety and equipment. Pt seated in recliner chair with call bell and all needed items within reach. Patient will benefit from acute OT to increase overall independence in the areas of ADLs, functional mobility, and safety awareness in order to safely discharge.      If plan is discharge home, recommend the following:   A little help with walking and/or transfers;A little help with bathing/dressing/bathroom;Assistance with cooking/housework;Assist for transportation;Help with stairs or ramp for entrance     Functional Status Assessment   Patient has had a recent decline in their functional status and demonstrates the ability to make significant improvements in function in a reasonable and predictable amount of time.     Equipment Recommendations   BSC/3in1      Precautions/Restrictions   Precautions Precautions: Fall Recall of Precautions/Restrictions: Intact     Mobility Bed Mobility Overal bed mobility: Needs  Assistance Bed Mobility: Supine to Sit     Supine to sit: Min assist          Transfers Overall transfer level: Needs assistance Equipment used: 2 person hand held assist Transfers: Sit to/from Stand Sit to Stand: Min assist, +2 physical assistance           General transfer comment: steadying assistance required for standing from bed and bed side commode      Balance Overall balance assessment: Needs assistance Sitting-balance support: Feet supported Sitting balance-Leahy Scale: Fair     Standing balance support: Single extremity supported Standing balance-Leahy Scale: Fair                             ADL either performed or assessed with clinical judgement   ADL Overall ADL's : Needs assistance/impaired                         Toilet Transfer: Minimal assistance;Rolling walker (2 wheels);BSC/3in1   Toileting- Clothing Manipulation and Hygiene: Minimal assistance;Sit to/from stand               Vision Patient Visual Report: No change from baseline              Pertinent Vitals/Pain Pain Assessment Pain Assessment: Faces Faces Pain Scale: Hurts a little bit Pain Location: abdomen Pain Descriptors / Indicators: Discomfort Pain Intervention(s): Limited activity within patient's tolerance, Monitored during session, Repositioned     Extremity/Trunk Assessment Upper Extremity Assessment Upper Extremity Assessment: Generalized weakness   Lower Extremity Assessment Lower Extremity Assessment: Generalized weakness  Communication Communication Communication: No apparent difficulties   Cognition Arousal: Alert Behavior During Therapy: WFL for tasks assessed/performed                                 Following commands: Intact       Cueing  General Comments   Cueing Techniques: Verbal cues  patient is generally weak compared to his baseline           Home Living Family/patient expects to be  discharged to:: Private residence Living Arrangements: Spouse/significant other Available Help at Discharge: Family;Available 24 hours/day Type of Home: House Home Access: Stairs to enter [employer] of Steps: 2 Entrance Stairs-Rails: None Home Layout: One level     Bathroom Shower/Tub: Tub/shower unit         Home Equipment: Shower seat          Prior Functioning/Environment Prior Level of Function : Independent/Modified Independent;Driving                    OT Problem List: Decreased strength;Decreased range of motion;Decreased safety awareness;Decreased activity tolerance;Impaired balance (sitting and/or standing);Decreased knowledge of precautions;Cardiopulmonary status limiting activity   OT Treatment/Interventions: Self-care/ADL training;Therapeutic activities;Therapeutic exercise;Energy conservation;Patient/family education;Balance training      OT Goals(Current goals can be found in the care plan section)   Acute Rehab OT Goals Patient Stated Goal: to go home OT Goal Formulation: With patient Time For Goal Achievement: 02/27/24 Potential to Achieve Goals: Fair ADL Goals Pt Will Perform Grooming: standing;with modified independence Pt Will Perform Lower Body Dressing: with supervision;sit to/from stand Pt Will Transfer to Toilet: with supervision;ambulating Pt Will Perform Toileting - Clothing Manipulation and hygiene: with supervision;sit to/from stand   OT Frequency:  Min 2X/week       AM-PAC OT 6 Clicks Daily Activity     Outcome Measure Help from another person eating meals?: A Little Help from another person taking care of personal grooming?: A Little Help from another person toileting, which includes using toliet, bedpan, or urinal?: A Little Help from another person bathing (including washing, rinsing, drying)?: A Little Help from another person to put on and taking off regular upper body clothing?: A Little Help from another  person to put on and taking off regular lower body clothing?: A Little 6 Click Score: 18   End of Session Equipment Utilized During Treatment: Rolling walker (2 wheels) Nurse Communication: Mobility status  Activity Tolerance: Patient tolerated treatment well Patient left: in chair;with call bell/phone within reach;with chair alarm set  OT Visit Diagnosis: Unsteadiness on feet (R26.81);Repeated falls (R29.6);Muscle weakness (generalized) (M62.81)                Time: 9075-9049 OT Time Calculation (min): 26 min Charges:  OT General Charges $OT Visit: 1 Visit OT Evaluation $OT Eval Moderate Complexity: 1 [address] [place], OTR/L , CBIS ascom [phone]  02/13/2024, 1:30 PM

## 2024-02-13 NOTE — Plan of Care (Signed)
" °  Problem: Coping: Goal: Ability to adjust to condition or change in health will improve Outcome: Progressing   Problem: Health Behavior/Discharge Planning: Goal: Ability to manage health-related needs will improve Outcome: Progressing   Problem: Metabolic: Goal: Ability to maintain appropriate glucose levels will improve Outcome: Progressing   Problem: Skin Integrity: Goal: Risk for impaired skin integrity will decrease Outcome: Progressing   Problem: Tissue Perfusion: Goal: Adequacy of tissue perfusion will improve Outcome: Progressing   Problem: Cardiac: Goal: Ability to maintain an adequate cardiac output will improve Outcome: Progressing   Problem: Health Behavior/Discharge Planning: Goal: Ability to identify and utilize available resources and services will improve Outcome: Progressing Goal: Ability to manage health-related needs will improve Outcome: Progressing   Problem: Fluid Volume: Goal: Ability to achieve a balanced intake and output will improve Outcome: Progressing   Problem: Metabolic: Goal: Ability to maintain appropriate glucose levels will improve Outcome: Progressing   Problem: Nutritional: Goal: Maintenance of adequate nutrition will improve Outcome: Progressing Goal: Maintenance of adequate weight for body size and type will improve Outcome: Progressing   Problem: Respiratory: Goal: Will regain and/or maintain adequate ventilation Outcome: Progressing   Problem: Education: Goal: Knowledge of General Education information will improve Description: Including pain rating scale, medication(s)/side effects and non-pharmacologic comfort measures Outcome: Progressing   Problem: Health Behavior/Discharge Planning: Goal: Ability to manage health-related needs will improve Outcome: Progressing   Problem: Clinical Measurements: Goal: Ability to maintain clinical measurements within normal limits will improve Outcome: Progressing Goal: Will remain  free from infection Outcome: Progressing Goal: Diagnostic test results will improve Outcome: Progressing Goal: Respiratory complications will improve Outcome: Progressing Goal: Cardiovascular complication will be avoided Outcome: Progressing   Problem: Activity: Goal: Risk for activity intolerance will decrease Outcome: Progressing   Problem: Nutrition: Goal: Adequate nutrition will be maintained Outcome: Progressing   Problem: Coping: Goal: Level of anxiety will decrease Outcome: Progressing   Problem: Elimination: Goal: Will not experience complications related to bowel motility Outcome: Progressing Goal: Will not experience complications related to urinary retention Outcome: Progressing   Problem: Pain Managment: Goal: General experience of comfort will improve and/or be controlled Outcome: Progressing   Problem: Safety: Goal: Ability to remain free from injury will improve Outcome: Progressing   Problem: Skin Integrity: Goal: Risk for impaired skin integrity will decrease Outcome: Progressing   Problem: Fluid Volume: Goal: Ability to maintain a balanced intake and output will improve Outcome: Not Progressing   Problem: Urinary Elimination: Goal: Ability to achieve and maintain adequate renal perfusion and functioning will improve Outcome: Not Progressing   "

## 2024-02-13 NOTE — Inpatient Diabetes Management (Signed)
 Inpatient Diabetes Program Recommendations  AACE/ADA: New Consensus Statement on Inpatient Glycemic Control (2015)  Target Ranges:  Prepandial:   less than 140 mg/dL      Peak postprandial:   less than 180 mg/dL (1-2 hours)      Critically ill patients:  140 - 180 mg/dL   Lab Results  Component Value Date   GLUCAP 118 (H) 02/13/2024    Review of Glycemic Control  Diabetes history: DM 2 Outpatient Diabetes medications: Toujeo  80 units Daily, Humalog 30 units breakfast, 45 units lunch, 45 units dinner, Ozempic 0.5 mg weekly, Jardiance 25 mg Daily, metformin 1000 mg bid Current orders for Inpatient glycemic control:  IV insulin /Endotool  Inpatient Diabetes Program Recommendations:    Note: transitioning from IV insulin  to SQ Pt was requiring 5-6 units of IV insulin  per hour to maintain glucose trends in the 100's.  -   Order Lantus  30 units bid -   Novolog  0-15 units tid + hs -   Novolog  5 units tid meal coverage if eating >50% of meals  Thanks,  Clotilda Bull RN, MSN, BC-ADM Inpatient Diabetes Coordinator Team Pager [phone] (8a-5p)

## 2024-02-13 NOTE — Plan of Care (Signed)

## 2024-02-14 DIAGNOSIS — J189 Pneumonia, unspecified organism: Secondary | ICD-10-CM | POA: Diagnosis not present

## 2024-02-14 LAB — BASIC METABOLIC PANEL WITH GFR
Anion gap: 11 (ref 5–15)
BUN: 17 mg/dL (ref 8–23)
CO2: 21 mmol/L — ABNORMAL LOW (ref 22–32)
Calcium: 8.2 mg/dL — ABNORMAL LOW (ref 8.9–10.3)
Chloride: 107 mmol/L (ref 98–111)
Creatinine, Ser: 0.91 mg/dL (ref 0.61–1.24)
GFR, Estimated: >60 mL/min
Glucose, Bld: 218 mg/dL — ABNORMAL HIGH (ref 70–99)
Potassium: 4.2 mmol/L (ref 3.5–5.1)
Sodium: 140 mmol/L (ref 135–145)

## 2024-02-14 LAB — CBC
HCT: 42.3 % (ref 39.0–52.0)
Hemoglobin: 14.3 g/dL (ref 13.0–17.0)
MCH: 29.5 pg (ref 26.0–34.0)
MCHC: 33.8 g/dL (ref 30.0–36.0)
MCV: 87.4 fL (ref 80.0–100.0)
Platelets: 407 K/uL — ABNORMAL HIGH (ref 150–400)
RBC: 4.84 MIL/uL (ref 4.22–5.81)
RDW: 13.9 % (ref 11.5–15.5)
WBC: 9.7 K/uL (ref 4.0–10.5)
nRBC: 0 % (ref 0.0–0.2)

## 2024-02-14 LAB — GLUCOSE, CAPILLARY: Glucose-Capillary: 210 mg/dL — ABNORMAL HIGH (ref 70–99)

## 2024-02-14 MED ORDER — INSULIN ASPART 100 UNIT/ML IJ SOLN: 10.0000 [IU] | Freq: Three times a day (TID) | INTRAMUSCULAR | Status: DC

## 2024-02-14 MED ORDER — DOXYCYCLINE HYCLATE 100 MG PO TABS: 100.0000 mg | ORAL_TABLET | Freq: Two times a day (BID) | ORAL | 0 refills | Status: AC

## 2024-02-14 MED ORDER — INSULIN GLARGINE 100 UNIT/ML ~~LOC~~ SOLN
40.0000 [IU] | Freq: Every day | SUBCUTANEOUS | Status: DC
  Filled 2024-02-14: qty 0.4

## 2024-02-14 MED ORDER — IPRATROPIUM-ALBUTEROL 0.5-2.5 (3) MG/3ML IN SOLN: 3.0000 mL | Freq: Four times a day (QID) | RESPIRATORY_TRACT | Status: DC | PRN

## 2024-02-14 MED ORDER — OSELTAMIVIR PHOSPHATE 75 MG PO CAPS: 75.0000 mg | ORAL_CAPSULE | Freq: Two times a day (BID) | ORAL | 0 refills | Status: AC

## 2024-02-14 NOTE — TOC Initial Note (Signed)
 Transition of Care [facility]) - Initial/Assessment Note    Patient Details  Name: Joseph Mcgee MRN: [id number] Date of Birth: 1955/10/20  Transition [facility]) CM/SW Contact:    Nathanael CHRISTELLA Ring, RN Phone Number: 02/14/2024, 12:07 PM  Clinical Narrative:                 Patient is from home with his wife.  CM met with patient and wife at the bedside, introduced self and explained role in DC planning.  Patient is alert, oriented, glad to be going home today.  Wife will transport home, he says he can do for himself but his wife will be able to help him at home.  Informed him that no home health agency could offer services likely due to not being in network with his insurance, [employer].  He says that the insurance called him and told him they were going to send a nurse out after DC.  No DME needs.   He says that start of the new year his insurance will be with [facility].  No other questions or concerns at this time.     Barriers to Discharge: Barriers Resolved   Patient Goals and CMS Choice Patient states their goals for this hospitalization and ongoing recovery are:: Glad to be going home          Expected Discharge Plan and Services         Expected Discharge Date: 02/14/24               DME Arranged: N/A DME Agency: NA         HH Agency: NA        Prior Living Arrangements/Services                       Activities of Daily Living      Permission Sought/Granted                  Emotional Assessment              Admission diagnosis:  Shortness of breath [R06.02] Dehydration [E86.0] Pneumonia [J18.9] Influenza A [J10.1] Diabetic ketoacidosis without coma associated with other specified diabetes mellitus (HCC) [E13.10] Pneumonia of left lung due to infectious organism, unspecified part of lung [J18.9] Patient Active Problem List   Diagnosis Date Noted   Pneumonia 02/12/2024   Erythrocytosis 09/29/2022   CKD (chronic kidney  disease) stage 3, GFR 30-59 ml/min (HCC) 09/21/2021   Encounter for antineoplastic chemotherapy 04/25/2021   Personal history of tobacco use, presenting hazards to health 04/25/2021   Ureteral calculi 04/01/2021   Anal cancer (HCC) 03/16/2021   Goals of care, counseling/discussion 03/16/2021   PCP:  Alla Amis, MD Pharmacy:   [facility] [id number] [facility] [place] - [address] [place] [address] Phone: [phone] Fax: [phone]  [id number] [place], [place] - [address] AT [facility] OF [address] [place] [address] Phone: [phone] Fax: [phone]     Social Drivers of Health (SDOH) Social History: SDOH Screenings   Food Insecurity: No Food Insecurity (02/13/2024)  Housing: Unknown (02/13/2024)  Transportation Needs: No Transportation Needs (02/13/2024)  Utilities: Not At Risk (02/13/2024)  Financial Resource Strain: Low Risk  (01/13/2024)   Received from [facility]  Social Connections: Socially Integrated (02/13/2024)  Tobacco Use: Medium Risk (02/12/2024)  SDOH Interventions:     Readmission Risk Interventions     No data to display

## 2024-02-14 NOTE — Discharge Summary (Signed)
 "    Physician Discharge Summary   Patient: Joseph Mcgee MRN: [id number]  DOB: 1955-07-17   Admit:     Date of Admission: 02/12/2024 Admitted from: home   Discharge: Date of discharge: 02/14/2024 Disposition: Home Condition at discharge: good  CODE STATUS: FULL CODE     Discharge Physician: Laneta Blunt, DO [facility]     PCP: Alla Amis, MD  Recommendations for Outpatient Follow-up:  Follow up with PCP Alla Amis, MD in 1-2 weeks    Discharge Instructions     Diet Carb Modified   Complete by: As directed    Increase activity slowly   Complete by: As directed          Discharge Diagnoses: Principal Problem:   Pneumonia       Hospital course / significant events:   HPI: Joseph Mcgee is a 68 y.o. 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  The patient was diagnosed with influenza A on 02/05/2024 and since then he has had a decline  12/23: to ED. In the ER, weak appearing, tachycardic and tachypneic, influenza A+.  Chest x-Kaylen showing mild patchy opacity at the left lung base, atelectasis versus pneumonia.  Due to concern for influenza A infection and superimposed bacterial pulmonary infection, the patient was started on Rocephin  and IV doxycycline  in the ER. Serum glucose of 228, serum bicarb 16, anion gap 23, beta hydroxybutyrate acid 5.12 - concern for euglycemic DKA. Admitted to hospitalist service on abx, insulin  infusion,  12/24: AG closed this morning, off insulin  gtt, remains on room air. Hyperglycemic this afternoon, increasing mealtime and basal insulin  12/25: feeling well this morning, Glc not quite to goal but pt feels comfortable to titrate up isulin at home, stable for discharge to finish po abx  and tamiflu       Consultants:  none  Procedures/Surgeries: none      ASSESSMENT & PLAN:   Left lower lobe pneumonia, in the setting of influenza A viral infection, POA Rocephin  and IV doxycycline , plan abx x 5-7 days total --> doxy po outpatient  Continue Tamiflu  75 mg twice daily x 5 days (limited utility given tested (+)Influena A 02/05/24 but will leave on this medication)  As needed antitussives Incentive spirometer  Anion gap metabolic acidosis  Euglycemic DKA type II Type 2 diabetes, insulin  dependent Recent A1C outpatient 11/29/2023 7.0 = at goal  Hold home meds Jardiance, metformin, Ozempic pending PCP follow up  Resume home insulin      Initially NO tachycardia/tachypnea but became so 02/13/24 00:45 and met sepsis/SIRS criteria. Leukocytosis 13.4, left lower lobe infiltrates on chest x-[place]binna suggestive of pneumonia. Question sepsis d/t PNA or SIRS d/t DKA  Treated underlying cause(s) as noted   Hypertension held home oral antihypertensives hydrochlorothiazide, losartan due to soft BPs  Closely monitor vital signs.   Hyperlipidemia Statin intolerance home Zetia       overweigh based on BMI: Body mass index is 27.47 kg/m.SABRA Significantly low or high BMI is associated with higher medical risk.  Underweight - under 18  overweight - 25 to 29 obese - 30 or more Class 1 obesity: BMI of 30.0 to 34 Class 2 obesity: BMI of 35.0 to 39 Class 3 obesity: BMI of 40.0 to 49 Super Morbid Obesity: BMI 50-59 Super-super Morbid Obesity: BMI 60+ Healthy nutrition  and physical activity advised as adjunct to other disease management and risk reduction treatments          Discharge Instructions  Allergies as of 02/14/2024       Reactions   Enalapril Maleate Cough   Lisinopril Cough   Lovastatin    Muscle Pain        Medication List     PAUSE taking these medications    hydrochlorothiazide 25 MG tablet Wait to take this until your doctor or other care  provider tells you to start again. Commonly known as: HYDRODIURIL Take 25 mg by mouth daily.   Jardiance 25 MG Tabs tablet Wait to take this until your doctor or other care provider tells you to start again. Generic drug: empagliflozin Take 25 mg by mouth daily.   losartan 50 MG tablet Wait to take this until your doctor or other care provider tells you to start again. Commonly known as: COZAAR Take 50 mg by mouth daily.   metFORMIN 1000 MG tablet Wait to take this until your doctor or other care provider tells you to start again. Commonly known as: GLUCOPHAGE Take 1,000 mg by mouth 2 (two) times daily.       STOP taking these medications    sulfamethoxazole -trimethoprim  800-160 MG tablet Commonly known as: BACTRIM  DS       TAKE these medications    aspirin  EC 81 MG tablet Take 1 tablet by mouth daily.   cetirizine 10 MG tablet Commonly known as: ZYRTEC Take 10 mg by mouth daily as needed for allergies.   cholecalciferol 25 MCG (1000 UNIT) tablet Commonly known as: VITAMIN D3 Take 1,000 Units by mouth daily.   cyanocobalamin  1000 MCG tablet Commonly known as: VITAMIN B12 Take 1,000 mcg by mouth daily.   Dexcom G7 Receiver Devi 1 each by Other route.   Dexcom G7 Sensor Misc 1 each by Other route.   doxycycline  100 MG tablet Commonly known as: VIBRA -TABS Take 1 tablet (100 mg total) by mouth 2 (two) times daily for 13 doses. Fist dose evening 02/14/24 then continue twice daily starting 02/15/24 until gone   ezetimibe  10 MG tablet Commonly known as: ZETIA  Take 10 mg by mouth daily.   famotidine  20 MG tablet Commonly known as: PEPCID  Take 1 tablet (20 mg total) by mouth 2 (two) times daily.   Fingerstix Lancets Misc 1 each by Other route.   Fish Oil 1000 MG Caps Take 1,000 mg by mouth daily.   ibuprofen  800 MG tablet Commonly known as: ADVIL  Take 800 mg by mouth every 8 (eight) hours as needed.   insulin  lispro 100 UNIT/ML KwikPen Commonly known  as: HUMALOG Inject into the skin. 30 units @ breakfast 45 units at lunch and supper 3 times a day   ondansetron  4 MG disintegrating tablet Commonly known as: ZOFRAN -ODT Take 1 tablet (4 mg total) by mouth every 8 (eight) hours as needed for nausea or vomiting.   oseltamivir  75 MG capsule Commonly known as: TAMIFLU  Take 1 capsule (75 mg total) by mouth 2 (two) times daily for 9 doses. Fist dose evening 02/14/24 then continue twice daily starting 02/15/24 until gone   Ozempic (0.25 or 0.5 MG/DOSE) 2 MG/3ML Sopn Generic drug: Semaglutide(0.25 or 0.5MG /DOS) Inject 0.5 mg into the skin once a week.   Toujeo  SoloStar 300 UNIT/ML Solostar Pen Generic drug: insulin  glargine (1 Unit Dial) Inject 80 Units into the skin at bedtime.   zinc gluconate 50 MG tablet Take 50 mg by  mouth daily.         Follow-up Information     Alla Amis, MD. Schedule an appointment as soon as possible for a visit.   Specialty: Family Medicine Why: hospital follow up Contact information: [address] [facility] [place] [address] [phone]                 Allergies[1]   Subjective: pt still has some cough but otherwise feeling much better and no SOB   Discharge Exam: BP 128/73 (BP Location: Left Arm)   Pulse 73   Temp 97.7 F (36.5 C)   Resp 18   Ht 5' 9 (1.753 m)   Wt 84.4 kg   SpO2 97%   BMI 27.47 kg/m  General: Pt is alert, awake, not in acute distress Cardiovascular: RRR, S1/S2 +, no rubs, no gallops Respiratory: CTA bilaterally, no wheezing, no rhonchi Abdominal: Soft, NT, ND, bowel sounds + Extremities: no edema, no cyanosis     The results of significant diagnostics from this hospitalization (including imaging, microbiology, ancillary and laboratory) are listed below for reference.     Microbiology: Recent Results (from the past 240 hours)  Resp panel by RT-PCR (RSV, Flu A&B, Covid) Anterior Nasal Swab     Status: Abnormal    Collection Time: 02/05/24  4:18 PM   Specimen: Anterior Nasal Swab  Result Value Ref Range Status   SARS Coronavirus 2 by RT PCR NEGATIVE NEGATIVE Final    Comment: (NOTE) SARS-CoV-2 target nucleic acids are NOT DETECTED.  The SARS-CoV-2 RNA is generally detectable in upper respiratory specimens during the acute phase of infection. The lowest concentration of SARS-CoV-2 viral copies this assay can detect is 138 copies/mL. A negative result does not preclude SARS-Cov-2 infection and should not be used as the sole basis for treatment or other patient management decisions. A negative result may occur with  improper specimen collection/handling, submission of specimen other than nasopharyngeal swab, presence of viral mutation(s) within the areas targeted by this assay, and inadequate number of viral copies(<138 copies/mL). A negative result must be combined with clinical observations, patient history, and epidemiological information. The expected result is Negative.  Fact Sheet for Patients:  bloggercourse.com  Fact Sheet for Healthcare Providers:  seriousbroker.it  This test is no t yet approved or cleared by the [place]  FDA and  has been authorized for detection and/or diagnosis of SARS-CoV-2 by FDA under an Emergency Use Authorization (EUA). This EUA will remain  in effect (meaning this test can be used) for the duration of the COVID-19 declaration under Section 564(b)(1) of the Act, 21 U.S.C.section 360bbb-3(b)(1), unless the authorization is terminated  or revoked sooner.       Influenza A by PCR POSITIVE (A) NEGATIVE Final   Influenza B by PCR NEGATIVE NEGATIVE Final    Comment: (NOTE) The Xpert Xpress SARS-CoV-2/FLU/RSV plus assay is intended as an aid in the diagnosis of influenza from Nasopharyngeal swab specimens and should not be used as a sole basis for treatment. Nasal washings and aspirates are unacceptable for  Xpert Xpress SARS-CoV-2/FLU/RSV testing.  Fact Sheet for Patients: bloggercourse.com  Fact Sheet for Healthcare Providers: seriousbroker.it  This test is not yet approved or cleared by the [place]  FDA and has been authorized for detection and/or diagnosis of SARS-CoV-2 by FDA under an Emergency Use Authorization (EUA). This EUA will remain in effect (meaning this test can be used) for the duration of the COVID-19 declaration under Section 564(b)(1) of the  Act, 21 U.S.C. section 360bbb-3(b)(1), unless the authorization is terminated or revoked.     Resp Syncytial Virus by PCR NEGATIVE NEGATIVE Final    Comment: (NOTE) Fact Sheet for Patients: bloggercourse.com  Fact Sheet for Healthcare Providers: seriousbroker.it  This test is not yet approved or cleared by the [place]  FDA and has been authorized for detection and/or diagnosis of SARS-CoV-2 by FDA under an Emergency Use Authorization (EUA). This EUA will remain in effect (meaning this test can be used) for the duration of the COVID-19 declaration under Section 564(b)(1) of the Act, 21 U.S.C. section 360bbb-3(b)(1), unless the authorization is terminated or revoked.  Performed at [facility], [address] [place], [place] [address]   Resp panel by RT-PCR (RSV, Flu A&B, Covid) Anterior Nasal Swab     Status: Abnormal   Collection Time: 02/12/24  7:50 PM   Specimen: Anterior Nasal Swab  Result Value Ref Range Status   SARS Coronavirus 2 by RT PCR NEGATIVE NEGATIVE Final    Comment: (NOTE) SARS-CoV-2 target nucleic acids are NOT DETECTED.  The SARS-CoV-2 RNA is generally detectable in upper respiratory specimens during the acute phase of infection. The lowest concentration of SARS-CoV-2 viral copies this assay can detect is 138 copies/mL. A negative result does not preclude SARS-Cov-2 infection and  should not be used as the sole basis for treatment or other patient management decisions. A negative result may occur with  improper specimen collection/handling, submission of specimen other than nasopharyngeal swab, presence of viral mutation(s) within the areas targeted by this assay, and inadequate number of viral copies(<138 copies/mL). A negative result must be combined with clinical observations, patient history, and epidemiological information. The expected result is Negative.  Fact Sheet for Patients:  bloggercourse.com  Fact Sheet for Healthcare Providers:  seriousbroker.it  This test is no t yet approved or cleared by the [place]  FDA and  has been authorized for detection and/or diagnosis of SARS-CoV-2 by FDA under an Emergency Use Authorization (EUA). This EUA will remain  in effect (meaning this test can be used) for the duration of the COVID-19 declaration under Section 564(b)(1) of the Act, 21 U.S.C.section 360bbb-3(b)(1), unless the authorization is terminated  or revoked sooner.       Influenza A by PCR POSITIVE (A) NEGATIVE Final   Influenza B by PCR NEGATIVE NEGATIVE Final    Comment: (NOTE) The Xpert Xpress SARS-CoV-2/FLU/RSV plus assay is intended as an aid in the diagnosis of influenza from Nasopharyngeal swab specimens and should not be used as a sole basis for treatment. Nasal washings and aspirates are unacceptable for Xpert Xpress SARS-CoV-2/FLU/RSV testing.  Fact Sheet for Patients: bloggercourse.com  Fact Sheet for Healthcare Providers: seriousbroker.it  This test is not yet approved or cleared by the [place]  FDA and has been authorized for detection and/or diagnosis of SARS-CoV-2 by FDA under an Emergency Use Authorization (EUA). This EUA will remain in effect (meaning this test can be used) for the duration of the COVID-19 declaration  under Section 564(b)(1) of the Act, 21 U.S.C. section 360bbb-3(b)(1), unless the authorization is terminated or revoked.     Resp Syncytial Virus by PCR NEGATIVE NEGATIVE Final    Comment: (NOTE) Fact Sheet for Patients: bloggercourse.com  Fact Sheet for Healthcare Providers: seriousbroker.it  This test is not yet approved or cleared by the [place]  FDA and has been authorized for detection and/or diagnosis of SARS-CoV-2 by FDA under an Emergency Use Authorization (EUA). This EUA will remain in effect (  meaning this test can be used) for the duration of the COVID-19 declaration under Section 564(b)(1) of the Act, 21 U.S.C. section 360bbb-3(b)(1), unless the authorization is terminated or revoked.  Performed at [facility], [address] [place], [place] [address]   MRSA Next Gen by PCR, Nasal     Status: None   Collection Time: 02/13/24  1:03 AM   Specimen: Nasal Mucosa; Nasal Swab  Result Value Ref Range Status   MRSA by PCR Next Gen NOT DETECTED NOT DETECTED Final    Comment: (NOTE) The GeneXpert MRSA Assay (FDA approved for NASAL specimens only), is one component of a comprehensive MRSA colonization surveillance program. It is not intended to diagnose MRSA infection nor to guide or monitor treatment for MRSA infections. Test performance is not FDA approved in patients less than 66 years old. Performed at [facility], [address] [place], [place] [address]      Labs: BNP (last 3 results) No results for input(s): BNP in the last 8760 hours. Basic Metabolic Panel: Recent Labs  Lab 02/13/24 0339 02/13/24 0827 02/13/24 1055 02/13/24 1459 02/14/24 0548  NA 137 136 134* 133* 140  K 4.1 3.8 4.2 4.9 4.2  CL 103 106 105 102 107  CO2 20* 19* 18* 15* 21*  GLUCOSE 134* 111* 247* 424* 218*  BUN 18 16 16 18 17   CREATININE 0.87 0.90 0.94 0.98 0.91  CALCIUM 8.0* 7.9* 7.9* 8.0* 8.2*  MG  --   2.2  --   --   --   PHOS  --  1.6*  --   --   --    Liver Function Tests: No results for input(s): AST, ALT, ALKPHOS, BILITOT, PROT, ALBUMIN in the last 168 hours. No results for input(s): LIPASE, AMYLASE in the last 168 hours. No results for input(s): AMMONIA in the last 168 hours. CBC: Recent Labs  Lab 02/12/24 1556 02/13/24 0827 02/14/24 0548  WBC 13.4* 12.0* 9.7  NEUTROABS  --  10.0*  --   HGB 17.3* 15.3 14.3  HCT 51.0 44.1 42.3  MCV 86.9 84.6 87.4  PLT 446* 385 407*   Cardiac Enzymes: No results for input(s): CKTOTAL, CKMB, CKMBINDEX, TROPONINI in the last 168 hours. BNP: Invalid input(s): POCBNP CBG: Recent Labs  Lab 02/13/24 0843 02/13/24 1050 02/13/24 1618 02/13/24 2203 02/14/24 0817  GLUCAP 118* 234* 417* 291* 210*   D-Dimer No results for input(s): DDIMER in the last 72 hours. Hgb A1c Recent Labs    02/12/24 2345  HGBA1C 8.1*   Lipid Profile No results for input(s): CHOL, HDL, LDLCALC, TRIG, CHOLHDL, LDLDIRECT in the last 72 hours. Thyroid function studies No results for input(s): TSH, T4TOTAL, T3FREE, THYROIDAB in the last 72 hours.  Invalid input(s): FREET3 Anemia work up No results for input(s): VITAMINB12, FOLATE, FERRITIN, TIBC, IRON, RETICCTPCT in the last 72 hours. Urinalysis    Component Value Date/Time   COLORURINE YELLOW (A) 02/12/2024 2158   APPEARANCEUR CLEAR (A) 02/12/2024 2158   APPEARANCEUR Slightly cloudy 10/30/2023 1135   LABSPEC 1.021 02/12/2024 2158   PHURINE 5.0 02/12/2024 2158   GLUCOSEU >=500 (A) 02/12/2024 2158   HGBUR SMALL (A) 02/12/2024 2158   BILIRUBINUR NEGATIVE 02/12/2024 2158   BILIRUBINUR Negative 10/30/2023 1135   KETONESUR 20 (A) 02/12/2024 2158   PROTEINUR NEGATIVE 02/12/2024 2158   NITRITE NEGATIVE 02/12/2024 2158   LEUKOCYTESUR NEGATIVE 02/12/2024 2158   Sepsis Labs Recent Labs  Lab 02/12/24 1556 02/13/24 0827 02/14/24 0548  WBC 13.4*  12.0* 9.7   Microbiology Recent Results (from the past 240 hours)  Resp panel by RT-PCR (RSV, Flu A&B, Covid) Anterior Nasal Swab     Status: Abnormal   Collection Time: 02/05/24  4:18 PM   Specimen: Anterior Nasal Swab  Result Value Ref Range Status   SARS Coronavirus 2 by RT PCR NEGATIVE NEGATIVE Final    Comment: (NOTE) SARS-CoV-2 target nucleic acids are NOT DETECTED.  The SARS-CoV-2 RNA is generally detectable in upper respiratory specimens during the acute phase of infection. The lowest concentration of SARS-CoV-2 viral copies this assay can detect is 138 copies/mL. A negative result does not preclude SARS-Cov-2 infection and should not be used as the sole basis for treatment or other patient management decisions. A negative result may occur with  improper specimen collection/handling, submission of specimen other than nasopharyngeal swab, presence of viral mutation(s) within the areas targeted by this assay, and inadequate number of viral copies(<138 copies/mL). A negative result must be combined with clinical observations, patient history, and epidemiological information. The expected result is Negative.  Fact Sheet for Patients:  bloggercourse.com  Fact Sheet for Healthcare Providers:  seriousbroker.it  This test is no t yet approved or cleared by the [place]  FDA and  has been authorized for detection and/or diagnosis of SARS-CoV-2 by FDA under an Emergency Use Authorization (EUA). This EUA will remain  in effect (meaning this test can be used) for the duration of the COVID-19 declaration under Section 564(b)(1) of the Act, 21 U.S.C.section 360bbb-3(b)(1), unless the authorization is terminated  or revoked sooner.       Influenza A by PCR POSITIVE (A) NEGATIVE Final   Influenza B by PCR NEGATIVE NEGATIVE Final    Comment: (NOTE) The Xpert Xpress SARS-CoV-2/FLU/RSV plus assay is intended as an aid in the  diagnosis of influenza from Nasopharyngeal swab specimens and should not be used as a sole basis for treatment. Nasal washings and aspirates are unacceptable for Xpert Xpress SARS-CoV-2/FLU/RSV testing.  Fact Sheet for Patients: bloggercourse.com  Fact Sheet for Healthcare Providers: seriousbroker.it  This test is not yet approved or cleared by the [place]  FDA and has been authorized for detection and/or diagnosis of SARS-CoV-2 by FDA under an Emergency Use Authorization (EUA). This EUA will remain in effect (meaning this test can be used) for the duration of the COVID-19 declaration under Section 564(b)(1) of the Act, 21 U.S.C. section 360bbb-3(b)(1), unless the authorization is terminated or revoked.     Resp Syncytial Virus by PCR NEGATIVE NEGATIVE Final    Comment: (NOTE) Fact Sheet for Patients: bloggercourse.com  Fact Sheet for Healthcare Providers: seriousbroker.it  This test is not yet approved or cleared by the [place]  FDA and has been authorized for detection and/or diagnosis of SARS-CoV-2 by FDA under an Emergency Use Authorization (EUA). This EUA will remain in effect (meaning this test can be used) for the duration of the COVID-19 declaration under Section 564(b)(1) of the Act, 21 U.S.C. section 360bbb-3(b)(1), unless the authorization is terminated or revoked.  Performed at [facility], [address] [place], [place] [address]   Resp panel by RT-PCR (RSV, Flu A&B, Covid) Anterior Nasal Swab     Status: Abnormal   Collection Time: 02/12/24  7:50 PM   Specimen: Anterior Nasal Swab  Result Value Ref Range Status   SARS Coronavirus 2 by RT PCR NEGATIVE NEGATIVE Final    Comment: (NOTE) SARS-CoV-2 target nucleic acids are NOT DETECTED.  The SARS-CoV-2 RNA is generally detectable  in upper respiratory specimens during the acute phase of  infection. The lowest concentration of SARS-CoV-2 viral copies this assay can detect is 138 copies/mL. A negative result does not preclude SARS-Cov-2 infection and should not be used as the sole basis for treatment or other patient management decisions. A negative result may occur with  improper specimen collection/handling, submission of specimen other than nasopharyngeal swab, presence of viral mutation(s) within the areas targeted by this assay, and inadequate number of viral copies(<138 copies/mL). A negative result must be combined with clinical observations, patient history, and epidemiological information. The expected result is Negative.  Fact Sheet for Patients:  bloggercourse.com  Fact Sheet for Healthcare Providers:  seriousbroker.it  This test is no t yet approved or cleared by the [place]  FDA and  has been authorized for detection and/or diagnosis of SARS-CoV-2 by FDA under an Emergency Use Authorization (EUA). This EUA will remain  in effect (meaning this test can be used) for the duration of the COVID-19 declaration under Section 564(b)(1) of the Act, 21 U.S.C.section 360bbb-3(b)(1), unless the authorization is terminated  or revoked sooner.       Influenza A by PCR POSITIVE (A) NEGATIVE Final   Influenza B by PCR NEGATIVE NEGATIVE Final    Comment: (NOTE) The Xpert Xpress SARS-CoV-2/FLU/RSV plus assay is intended as an aid in the diagnosis of influenza from Nasopharyngeal swab specimens and should not be used as a sole basis for treatment. Nasal washings and aspirates are unacceptable for Xpert Xpress SARS-CoV-2/FLU/RSV testing.  Fact Sheet for Patients: bloggercourse.com  Fact Sheet for Healthcare Providers: seriousbroker.it  This test is not yet approved or cleared by the [place]  FDA and has been authorized for detection and/or diagnosis of  SARS-CoV-2 by FDA under an Emergency Use Authorization (EUA). This EUA will remain in effect (meaning this test can be used) for the duration of the COVID-19 declaration under Section 564(b)(1) of the Act, 21 U.S.C. section 360bbb-3(b)(1), unless the authorization is terminated or revoked.     Resp Syncytial Virus by PCR NEGATIVE NEGATIVE Final    Comment: (NOTE) Fact Sheet for Patients: bloggercourse.com  Fact Sheet for Healthcare Providers: seriousbroker.it  This test is not yet approved or cleared by the [place]  FDA and has been authorized for detection and/or diagnosis of SARS-CoV-2 by FDA under an Emergency Use Authorization (EUA). This EUA will remain in effect (meaning this test can be used) for the duration of the COVID-19 declaration under Section 564(b)(1) of the Act, 21 U.S.C. section 360bbb-3(b)(1), unless the authorization is terminated or revoked.  Performed at [facility], [address] [place], [place] [address]   MRSA Next Gen by PCR, Nasal     Status: None   Collection Time: 02/13/24  1:03 AM   Specimen: Nasal Mucosa; Nasal Swab  Result Value Ref Range Status   MRSA by PCR Next Gen NOT DETECTED NOT DETECTED Final    Comment: (NOTE) The GeneXpert MRSA Assay (FDA approved for NASAL specimens only), is one component of a comprehensive MRSA colonization surveillance program. It is not intended to diagnose MRSA infection nor to guide or monitor treatment for MRSA infections. Test performance is not FDA approved in patients less than 63 years old. Performed at [facility], [address], [place], [place] [address]    Imaging DG Chest 2 View Result Date: 02/12/2024 CLINICAL DATA:  Shortness of breath. EXAM: CHEST - 2 VIEW COMPARISON:  04/20/2021 FINDINGS: Normal heart size with stable mediastinal contours. Mild  patchy opacity at the left lung base. No pulmonary edema, pleural  effusion, or pneumothorax. On limited assessment, no acute osseous findings. IMPRESSION: Mild patchy opacity at the left lung base, atelectasis versus pneumonia. Electronically Signed   By: Andrea Gasman M.D.   On: 02/12/2024 17:39      Time coordinating discharge: over 30 minutes  SIGNED:  Xzavion Doswell DO [facility]       [1]  Allergies Allergen Reactions   Enalapril Maleate Cough   Lisinopril Cough   Lovastatin     Muscle Pain   "

## 2024-02-15 LAB — BLOOD GAS, VENOUS
Acid-base deficit: 2.5 mmol/L — ABNORMAL HIGH (ref 0.0–2.0)
Bicarbonate: 21.7 mmol/L (ref 20.0–28.0)
Collection site: 708511
O2 Saturation: 29.1 %
Patient temperature: 37
pCO2, Ven: 35 mmHg — ABNORMAL LOW (ref 44–60)
pH, Ven: 7.4 (ref 7.25–7.43)

## 2024-03-19 ENCOUNTER — Ambulatory Visit: Payer: Medicare (Managed Care) | Admitting: Urology

## 2024-03-21 ENCOUNTER — Ambulatory Visit: Payer: Medicare (Managed Care) | Admitting: Urology

## 2024-04-14 ENCOUNTER — Other Ambulatory Visit: Payer: Medicare (Managed Care)

## 2024-04-14 ENCOUNTER — Ambulatory Visit: Payer: Medicare (Managed Care) | Admitting: Oncology

## 2024-04-21 ENCOUNTER — Other Ambulatory Visit: Payer: Medicare (Managed Care)

## 2024-04-21 ENCOUNTER — Ambulatory Visit: Payer: Medicare (Managed Care) | Admitting: Oncology

## 2024-10-17 ENCOUNTER — Other Ambulatory Visit: Payer: Medicare (Managed Care)

## 2024-10-17 ENCOUNTER — Ambulatory Visit: Payer: Medicare (Managed Care) | Admitting: Oncology
